# Patient Record
Sex: Male | Born: 1985 | Race: Black or African American | Hispanic: No | Marital: Single | Smoking: Never smoker
Health system: Southern US, Community
[De-identification: ages and names within clinical notes are randomized; demographics above are authoritative.]

## PROBLEM LIST (undated history)

## (undated) DIAGNOSIS — I340 Nonrheumatic mitral (valve) insufficiency: Secondary | ICD-10-CM

## (undated) DIAGNOSIS — R011 Cardiac murmur, unspecified: Secondary | ICD-10-CM

## (undated) HISTORY — DX: Cardiac murmur, unspecified: R01.1

## (undated) HISTORY — DX: Nonrheumatic mitral (valve) insufficiency: I34.0

## (undated) HISTORY — PX: SHOULDER SURGERY: SHX246

---
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
## 2005-04-26 ENCOUNTER — Emergency Department (HOSPITAL_COMMUNITY): Admission: EM | Admit: 2005-04-26 | Discharge: 2005-04-26 | Payer: Self-pay | Admitting: Emergency Medicine

## 2006-04-05 ENCOUNTER — Ambulatory Visit: Payer: Self-pay | Admitting: Family Medicine

## 2006-04-06 ENCOUNTER — Encounter: Admission: RE | Admit: 2006-04-06 | Discharge: 2006-04-06 | Payer: Self-pay | Admitting: Family Medicine

## 2007-08-18 ENCOUNTER — Emergency Department (HOSPITAL_COMMUNITY): Admission: EM | Admit: 2007-08-18 | Discharge: 2007-08-18 | Payer: Self-pay | Admitting: Emergency Medicine

## 2008-03-30 ENCOUNTER — Telehealth (INDEPENDENT_AMBULATORY_CARE_PROVIDER_SITE_OTHER): Payer: Self-pay | Admitting: *Deleted

## 2008-04-13 ENCOUNTER — Ambulatory Visit: Payer: Self-pay | Admitting: Family Medicine

## 2008-04-13 DIAGNOSIS — R079 Chest pain, unspecified: Secondary | ICD-10-CM | POA: Insufficient documentation

## 2008-04-13 DIAGNOSIS — R011 Cardiac murmur, unspecified: Secondary | ICD-10-CM | POA: Insufficient documentation

## 2008-04-16 ENCOUNTER — Ambulatory Visit: Payer: Self-pay | Admitting: Family Medicine

## 2008-04-19 ENCOUNTER — Telehealth (INDEPENDENT_AMBULATORY_CARE_PROVIDER_SITE_OTHER): Payer: Self-pay | Admitting: *Deleted

## 2008-04-19 LAB — CONVERTED CEMR LAB
Albumin: 4.2 g/dL (ref 3.5–5.2)
Basophils Absolute: 0 10*3/uL (ref 0.0–0.1)
Basophils Relative: 0.1 % (ref 0.0–3.0)
CO2: 30 meq/L (ref 19–32)
Cholesterol: 129 mg/dL (ref 0–200)
Eosinophils Absolute: 0 10*3/uL (ref 0.0–0.7)
GFR calc non Af Amer: 107.25 mL/min (ref >60)
HDL: 41 mg/dL (ref >39.00)
LDL Cholesterol: 73 mg/dL (ref 0–99)
Lymphs Abs: 1.1 10*3/uL (ref 0.7–4.0)
MCHC: 34.7 g/dL (ref 30.0–36.0)
MCV: 89.4 fL (ref 78.0–100.0)
Monocytes Relative: 11.1 % (ref 3.0–12.0)
Neutro Abs: 2 10*3/uL (ref 1.4–7.7)
Platelets: 166 10*3/uL (ref 150.0–400.0)
Potassium: 3.8 meq/L (ref 3.5–5.1)
RBC: 5.18 M/uL (ref 4.22–5.81)
RDW: 12.1 % (ref 11.5–14.6)
Sodium: 142 meq/L (ref 135–145)
Total Bilirubin: 1.1 mg/dL (ref 0.3–1.2)

## 2008-04-27 ENCOUNTER — Ambulatory Visit: Payer: Self-pay | Admitting: Cardiovascular Disease

## 2008-04-27 ENCOUNTER — Encounter: Payer: Self-pay | Admitting: Cardiovascular Disease

## 2008-05-17 ENCOUNTER — Encounter: Payer: Self-pay | Admitting: Cardiovascular Disease

## 2008-05-17 ENCOUNTER — Ambulatory Visit: Payer: Self-pay

## 2008-05-17 ENCOUNTER — Ambulatory Visit: Payer: Self-pay | Admitting: Cardiovascular Disease

## 2009-09-19 ENCOUNTER — Ambulatory Visit: Payer: Self-pay | Admitting: Family Medicine

## 2009-09-20 LAB — CONVERTED CEMR LAB
Albumin: 4.2 g/dL (ref 3.5–5.2)
Basophils Absolute: 0 10*3/uL (ref 0.0–0.1)
Basophils Relative: 0.4 % (ref 0.0–3.0)
Bilirubin, Direct: 0.2 mg/dL (ref 0.0–0.3)
Calcium: 9.3 mg/dL (ref 8.4–10.5)
Chlamydia, Swab/Urine, PCR: NEGATIVE
Cholesterol: 116 mg/dL (ref 0–200)
Creatinine, Ser: 1.1 mg/dL (ref 0.4–1.5)
GC Probe Amp, Urine: NEGATIVE
GFR calc non Af Amer: 101.64 mL/min (ref >60)
Glucose, Bld: 80 mg/dL (ref 70–99)
HCT: 42.9 % (ref 39.0–52.0)
Hemoglobin: 14.7 g/dL (ref 13.0–17.0)
Lymphs Abs: 1.1 10*3/uL (ref 0.7–4.0)
MCHC: 34.4 g/dL (ref 30.0–36.0)
MCV: 89.3 fL (ref 78.0–100.0)
Monocytes Absolute: 0.5 10*3/uL (ref 0.1–1.0)
Monocytes Relative: 11.5 % (ref 3.0–12.0)
Neutrophils Relative %: 60.4 % (ref 43.0–77.0)
Platelets: 169 10*3/uL (ref 150.0–400.0)
Sodium: 141 meq/L (ref 135–145)
Total Bilirubin: 0.9 mg/dL (ref 0.3–1.2)
Total CHOL/HDL Ratio: 3
Total Protein: 6.8 g/dL (ref 6.0–8.3)
Triglycerides: 27 mg/dL (ref 0.0–149.0)
WBC: 4.3 10*3/uL — ABNORMAL LOW (ref 4.5–10.5)

## 2010-02-19 LAB — CONVERTED CEMR LAB
Chlamydia, Swab/Urine, PCR: NEGATIVE
GC Probe Amp, Urine: NEGATIVE

## 2010-02-21 NOTE — Assessment & Plan Note (Signed)
Summary: CPX/CBS   Vital Signs:  Patient profile:   25 year old male Height:      70 inches Weight:      209 pounds BMI:     30.10 Pulse rate:   82 / minute BP sitting:   126 / 80  (left arm)  Vitals Entered By: Doristine Devoid CMA (September 19, 2009 8:29 AM) CC: CPX AND LABS   History of Present Illness: 25 yo man here today for CPE.  no concerns.  Preventive Screening-Counseling & Management  Alcohol-Tobacco     Alcohol drinks/day: 0     Smoking Status: never  Caffeine-Diet-Exercise     Does Patient Exercise: yes     Type of exercise: weight lifting, running     Times/week: 6      Drug Use:  never.    Current Medications (verified): 1)  No Meds At This Time  Allergies (verified): No Known Drug Allergies  Past History:  Past Medical History: + heart murmur- cleared by cards as child Normal ECHO and cards w/u- 2010  Past Surgical History: Reviewed history from 04/13/2008 and no changes required. heart cath in 7th grade, no intervention performed.  pt not sure what prompted procedure R- shoulder Rotator cuff repair  Family History: Reviewed history from 04/13/2008 and no changes required. 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    Physical Exam  General:  Well-developed,well-nourished,in no acute distress; alert,appropriate and cooperative throughout  examination Head:  Normocephalic and atraumatic without obvious abnormalities. Eyes:  No corneal or conjunctival inflammation noted. EOMI. Perrla. Funduscopic exam benign, without hemorrhages, exudates or papilledema. Vision grossly normal. Ears:  External ear exam shows no significant lesions or deformities.  Otoscopic examination reveals clear canals, tympanic membranes are intact bilaterally without bulging, retraction, inflammation or discharge. Hearing is grossly normal bilaterally. Nose:  External nasal examination shows no deformity or inflammation. Nasal mucosa are pink and moist without lesions or exudates. Mouth:  Oral mucosa and oropharynx without lesions or exudates.  Teeth in good repair.  braces in place Neck:  No deformities, masses, or tenderness noted. Lungs:  Normal respiratory effort, chest expands symmetrically. Lungs are clear to auscultation, no crackles or wheezes. Heart:  III/VI SEM heard best at upper sternal border Abdomen:  Bowel sounds positive,abdomen soft and non-tender without masses, organomegaly or hernias noted. Genitalia:  Testes bilaterally descended without nodularity, tenderness or masses. No scrotal masses or lesions. No penis lesions or urethral discharge. Msk:  No deformity or scoliosis noted of thoracic or lumbar spine.   Pulses:  +2 carotid, radial, DP Extremities:  No clubbing, cyanosis, edema, or deformity noted with normal full range of motion of all joints.   Neurologic:  No cranial nerve deficits noted. Station and gait are normal. Plantar reflexes are down-going bilaterally. DTRs are symmetrical throughout. Sensory, motor and coordinative functions appear intact. Skin:  Intact without suspicious lesions or rashes, tattoos Cervical Nodes:  No lymphadenopathy noted Inguinal Nodes:  No significant adenopathy Psych:  Cognition and judgment appear intact. Alert and cooperative with  normal attention span and concentration. No apparent delusions, illusions,  hallucinations   Impression & Recommendations:  Problem # 1:  HEALTHY ADULT MALE (ICD-V70.0) Assessment Unchanged pt's PE WNL, unchanged from previous.  check labs.  anticipatory guidance provided. Orders: Venipuncture (16109) TLB-Lipid Panel (80061-LIPID) TLB-BMP (Basic Metabolic Panel-BMET) (80048-METABOL) TLB-CBC Platelet - w/Differential (85025-CBCD) TLB-Hepatic/Liver Function Pnl (80076-HEPATIC) TLB-TSH (Thyroid Stimulating Hormone) (84443-TSH)  Problem # 2:  CONTACT OR EXPOSURE TO OTHER VIRAL DISEASES (ICD-V01.79) Assessment: Unchanged check labs at pt's request Orders: T-GC Probe, urine [phone]) T-Chlamydia  Probe, urine [id number]) T-RPR (Syphilis) ([id number]) T-HIV Antibody  (Reflex) ([id number])  Complete Medication List: 1)  No Meds At This Time   Patient Instructions: 1)  Follow up in 1 year or as needed 2)  Keep up the good work on diet and exercise 3)  We'll notify you of your lab results 4)  Call with any questions or concerns 5)  Have a great labor day!

## 2010-06-09 NOTE — Op Note (Signed)
NAMEJAREE, [name]                            ACCOUNT NO.:  [id number]   MEDICAL RECORD NO.:  [id number]                   PATIENT TYPE:  AMB   LOCATION:  [facility]SC                                  FACILITY:  [facility]   PHYSICIAN:  Robert A. Thurston Hole, M.D.              DATE OF BIRTH:  1985/09/17   DATE OF PROCEDURE:  12/16/2001  DATE OF DISCHARGE:                                 OPERATIVE REPORT   PREOPERATIVE DIAGNOSIS:  Right shoulder instability.   POSTOPERATIVE DIAGNOSIS:  1. 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  2. Right shoulder glenoid labrum tear with loose body.   PROCEDURE:  1. Right shoulder EOA followed by arthroscopic debridement and removal of     loose body.  2. Right shoulder open Bankart repair using Arthrex suture anchors x3.   SURGEON:  Elana Alm. Thurston Hole, M.D.   ASSISTANT:  Candace Cruise. Minor, P.A.   ANESTHESIA:  General.   OPERATIVE TIME:  One hour and forty-five minutes.   COMPLICATIONS:  None.   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  Persistent pain and instability that has failed  conservative care with exam and MRI documenting Bankart lesion and  instability and is now to undergo arthroscopy and Bankart repair.   DESCRIPTION:  The patient was brought to the operating room on December 16, 2001 placed on the operative table in supine position.  After an adequate  level of general anesthesia was obtained his right shoulder was examined  under anesthesia.  He had full range of motion and his shoulder showed  anterior-inferior subluxation on the right which he did not have on the  left.  Both shoulders have mild posterior laxity equal in nature.  He was  then placed in a beach-chair position and his shoulder and arm was prepped  using sterile Betadine and draped using sterile technique.  He received  Ancef 1 g IV preoperatively for prophylaxis.  Initially the arthroscopy was  performed.  Through a posterior arthroscopic portal the arthroscope with a  pump attached was placed and through an anterior portal an arthroscopic  probe was placed.  On initial inspection the articular cartilage in the  glenohumeral joint was intact.  The anterior labrum showed tearing with a  loose piece of labral tissue in the anterior labrum area and this was  debrided and removed.  The anterior-inferior labrum was detached from the  glenoid consistent with a Bankart lesion along with the anterior-inferior  glenohumeral ligament complex from the 6 o'clock position up to the 3  o'clock position.  The superior labrum was intact, the biceps tendon anchor  was intact and the biceps tendon was intact.  The posterior labrum was also  intact.  The rotator cuff was thoroughly inspected and there was found  to be  no evidence of a tear.  At this point then it was felt that the Bankart  reconstruction would be indicated.  The arthroscopic instruments were  removed.  A 5 cm anterior axillary incision was made.  The underlying  subcutaneous tissues were incised along with the skin incision.  The  deltopectoral groove was entered and the cephalic vein carefully protected  while the underlying scapularis tendon was exposed and the conjoint tendon  carefully retracted protecting the musculocutaneous nerve.  The  subscapularis tendon was then detached from its lesser tuberosity attachment  making superior and inferior limbs protecting the axillary nerve.  The  anterior aspect of the shoulder joint was then entered.  The anterior-  inferior glenoid rim was exposed.  An Arthrex suture anchor was first placed  in the 5:30 position and then this suture was passed through the anterior-  inferior glenohumeral ligament complex which had been torn and detached from  the glenoid rim.  Two other Arthrex suture anchors were placed - one  in the  4 o'clock position and one in the 2 o'clock position.  Each one of these had  the sutures passed through the torn labral anterior-inferior glenohumeral  ligament complex as well.  After the sutures were passed and each of these  were tied down tightly thus reapproximating and reefing and reconstructing  the anterior-inferior glenohumeral ligament complex back to its normal  anatomic position.  After this was done there was found to be no further  instability.  At this point then the wound was irrigated.  The subscapularis  tendon along with the anterior joint capsule was reattached to its lesser  tuberosity attachment using 2-Ethibond suture.  At this point the shoulder  could be brought up to the 90 degrees of abduction and 60 degrees of  external rotation before there was any excessive tension on the repair.  The  wound was further irrigated then closed.  0-Vicryl was used to close the  deltopectoral fascia and subcutaneous tissues and 2-0 Vicryl for the  subcuticular layer.  Steri-Strips were applied.  The arthroscopic holes were  closed with 3-0 nylon suture.  Sterile dressings and a shoulder immobilizer  was applied after the wound was injected with 0.25% Marcaine.  The patient  then awakened, extubated, taken to recovery room in stable condition.  Needle and sponge counts correct x2 at the end of the case.   FOLLOWUP CARE:  The patient will be followed as an overnight recovery care  patient for IV pain control and neurovascular monitoring.  Discharge  tomorrow on Percocet and Naprosyn.  See him back in the office in a week for  sutures out and followup.                                               Robert A. Thurston Hole, M.D.    RAW/MEDQ  D:  12/16/2001  T:  12/16/2001  Job:  [id number]

## 2010-06-09 NOTE — Assessment & Plan Note (Signed)
[facility]                        [facility] [facility] OFFICE NOTE   NAME:[name], [name]                         MRN:          [id number]  DATE:04/05/2006                            DOB:          September 14, 1985    REASON FOR VISIT:  Low back pain.   [name] is a 25 year old male who presents here to establish care.  Patient reports that about 3 weeks ago he started having right lower  back pain.  The pain was very severe that he went to the urgent care.  He had been seen a couple of times, and had been diagnosed with muscle  spasms and put on muscle relaxant and ibuprofen.  Patient reports that  he had an x-ray which was unremarkable.  He continues to have  significant discomfort, and reports that Tuesday the pain radiated down  his right leg.  Patient is a Pharmacist, hospital at [employer].  [name] reports that any type of bending precipitates the discomfort,  even coughing causes some discomfort in the lower back.  He denies any  bladder or bowel dysfunction.   PAST MEDICAL HISTORY:  None.   PHYSICAL HISTORY:  Right shoulder surgery for rotator cuff repair.   FAMILY HISTORY:  Mother and father alive and well.  He has 4 siblings  who are alive and well.   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  He is single and denies any  alcohol or tobacco use.   HEALTH MAINTENANCE:  Patient states he is up to date on his  immunizations.   REVIEW OF SYSTEMS:  As per HPI.  Additionally, he denies any  paresthesias or weakness of his lower extremity.  He also denies any  urinary symptoms.   OBJECTIVE:  Blood pressure 112/76, weight 193, pulse 78.  GENERAL:  A pleasant, muscularly-developed male in no acute distress.  Examination of the lower back significant for no midline tenderness.  There is mild-to-moderate tenderness in the right lower spinal lumbar  area.  Straight leg raise was  positive, greater left than right.  Deep  tendon reflexes were 2+ and equal bilaterally.  Patient was able to walk  on his heels and toes without significant difficulty.   IMPRESSION:  A 25 year old male with 3-week history of right lower back  pain, now radiating into his right leg.  Nerve impingement secondary to  a bulging disk is high on the differential.   PLAN:  1. Advise patient that he can continue with the muscle relaxants      given.  Additionally, we will add a prednisone pack 5/21, side      effects and instructions provided.  2. Will obtain an MRI for further assessment of his L-spine.  3. Will have patient sign the release for records, to obtain records      from [facility]      on [address].  4. Patient advised if his symptoms worsen in any way, he needs to seek  medical attention.     Leanne Chang, M.D.  Electronically Signed    LA/MedQ  DD: 04/05/2006  DT: 04/06/2006  Job #: [id number]

## 2010-08-10 ENCOUNTER — Encounter: Payer: Self-pay | Admitting: Family Medicine

## 2010-08-23 ENCOUNTER — Ambulatory Visit (INDEPENDENT_AMBULATORY_CARE_PROVIDER_SITE_OTHER): Payer: BC Managed Care – PPO | Admitting: Family Medicine

## 2010-08-23 ENCOUNTER — Encounter: Payer: Self-pay | Admitting: Family Medicine

## 2010-08-23 DIAGNOSIS — Z20828 Contact with and (suspected) exposure to other viral communicable diseases: Secondary | ICD-10-CM

## 2010-08-23 DIAGNOSIS — Z0000 Encounter for general adult medical examination without abnormal findings: Secondary | ICD-10-CM

## 2010-08-23 DIAGNOSIS — M549 Dorsalgia, unspecified: Secondary | ICD-10-CM

## 2010-08-23 LAB — LIPID PANEL
HDL: 47.8 mg/dL (ref >39.00)
Triglycerides: 57 mg/dL (ref 0.0–149.0)

## 2010-08-23 LAB — RPR

## 2010-08-23 LAB — BASIC METABOLIC PANEL
CO2: 26 mEq/L (ref 19–32)
Chloride: 103 mEq/L (ref 96–112)
Creatinine, Ser: 1.1 mg/dL (ref 0.4–1.5)
GFR: 104.01 mL/min (ref >60.00)
Glucose, Bld: 89 mg/dL (ref 70–99)

## 2010-08-23 LAB — CBC WITH DIFFERENTIAL/PLATELET
Basophils Absolute: 0 10*3/uL (ref 0.0–0.1)
Basophils Relative: 0.3 % (ref 0.0–3.0)
Eosinophils Absolute: 0.1 10*3/uL (ref 0.0–0.7)
Eosinophils Relative: 1.2 % (ref 0.0–5.0)
HCT: 46.7 % (ref 39.0–52.0)
MCHC: 33.4 g/dL (ref 30.0–36.0)
Monocytes Relative: 10.2 % (ref 3.0–12.0)
Neutro Abs: 2.5 10*3/uL (ref 1.4–7.7)

## 2010-08-23 MED ORDER — NAPROXEN 500 MG PO TABS: 500.0000 mg | ORAL_TABLET | Freq: Two times a day (BID) | ORAL | Status: DC

## 2010-08-23 NOTE — Progress Notes (Signed)
  Subjective:    Patient ID: [name], male    DOB: 03/25/1985, 25 y.o.   MRN: [id number]  HPI CPE- no concerns today.   Review of Systems Patient reports no vision/hearing changes, anorexia, fever ,adenopathy, persistant/recurrent hoarseness, swallowing issues, chest pain, palpitations, edema, persistant/recurrent cough, hemoptysis, dyspnea (rest,exertional, paroxysmal nocturnal), gastrointestinal  bleeding (melena, rectal bleeding), abdominal pain, excessive heart burn, GU symptoms (dysuria, hematuria, voiding/incontinence issues) syncope, focal weakness, memory loss, numbness & tingling, skin/hair/nail changes, depression, anxiety, abnormal bruising/bleeding.  + back pain after lifting at gym on Monday    Objective:   Physical Exam BP 118/80  Pulse 59  Temp(Src) 98.6 F (37 C) (Oral)  Ht 5' 9.5" (1.765 m)  Wt 209 lb 12.8 oz (95.165 kg)  BMI 30.54 kg/m2  SpO2 98%  General Appearance:    Alert, cooperative, no distress, appears stated age  Head:    Normocephalic, without obvious abnormality, atraumatic  Eyes:    PERRL, conjunctiva/corneas clear, EOM's intact, fundi    benign, both eyes       Ears:    Normal TM's and external ear canals, both ears  Nose:   Nares normal, septum midline, mucosa normal, no drainage   or sinus tenderness  Throat:   Lips, mucosa, and tongue normal; teeth and gums normal.  Very large tonsils w/out erythema or exudate  Neck:   Supple, symmetrical, trachea midline, no adenopathy;       thyroid:  No enlargement/tenderness/nodules  Back:     Symmetric, no curvature, + R lumbar paraspinal tenderness and spasm.  Limited flexion and extension due to pain.  + SLR on R  Lungs:     Clear to auscultation bilaterally, respirations unlabored  Chest wall:    No tenderness or deformity  Heart:    Regular rate and rhythm, S1 and S2 normal, I/VI SEM at LUSB, no rub   or gallop  Abdomen:     Soft, non-tender, bowel sounds active all four quadrants,    no masses,  no organomegaly  Genitalia:    Normal male without lesion, masses, discharge or tenderness  Rectal:    Deferred due to age  Extremities:   Extremities normal, atraumatic, no cyanosis or edema  Pulses:   2+ and symmetric all extremities  Skin:   Skin color, texture, turgor normal, no rashes or lesions  Lymph nodes:   Cervical, supraclavicular, and axillary nodes normal  Neurologic:   CNII-XII intact. Normal strength, sensation and reflexes      throughout          Assessment & Plan:

## 2010-08-23 NOTE — Patient Instructions (Signed)
Your exam looks great!  Keep up the good work! Take the Naproxen- w/ food to avoid upset stomach- twice daily for the next 7-10 days for your back pain Use a heating pad for pain relief We'll notify you of your lab results If your pain is no better in 2 weeks- call me! Hang in there!

## 2010-08-23 NOTE — Assessment & Plan Note (Signed)
Check labs.  Discussed importance of safe sex.

## 2010-08-23 NOTE — Assessment & Plan Note (Signed)
Pt w/ lumbar strain after lifting.  Start scheduled NSAIDs, heating pad prn for pain relief.  If no improvement or worsening, pt to call and will refer to ortho.  Pt expressed understanding and is in agreement w/ plan.

## 2010-08-23 NOTE — Assessment & Plan Note (Signed)
Pt's PE WNL w/ exception of low back pain.  Check labs.  Anticipatory guidance provided.

## 2010-08-24 LAB — HEPATIC FUNCTION PANEL
ALT: 19 U/L (ref 0–53)
AST: 20 U/L (ref 0–37)
Albumin: 4.5 g/dL (ref 3.5–5.2)
Alkaline Phosphatase: 51 U/L (ref 39–117)
Bilirubin, Direct: 0 mg/dL (ref 0.0–0.3)
Total Bilirubin: 0.7 mg/dL (ref 0.3–1.2)
Total Protein: 7.2 g/dL (ref 6.0–8.3)

## 2010-08-24 LAB — GC/CHLAMYDIA PROBE AMP, URINE
Chlamydia, Swab/Urine, PCR: NEGATIVE
GC Probe Amp, Urine: NEGATIVE

## 2010-08-25 ENCOUNTER — Encounter: Payer: Self-pay | Admitting: *Deleted

## 2010-10-12 ENCOUNTER — Ambulatory Visit (INDEPENDENT_AMBULATORY_CARE_PROVIDER_SITE_OTHER): Payer: BC Managed Care – PPO | Admitting: Family Medicine

## 2010-10-12 ENCOUNTER — Encounter: Payer: Self-pay | Admitting: Family Medicine

## 2010-10-12 VITALS — BP 126/82 | Temp 99.0°F | Wt 205.0 lb

## 2010-10-12 DIAGNOSIS — R1013 Epigastric pain: Secondary | ICD-10-CM

## 2010-10-12 MED ORDER — ESOMEPRAZOLE MAGNESIUM 40 MG PO CPDR: 40.0000 mg | DELAYED_RELEASE_CAPSULE | Freq: Every day | ORAL | Status: DC

## 2010-10-12 NOTE — Patient Instructions (Signed)
We'll notify you of your lab results Start the Nexium daily If no better in 1 week- call me and we'll send you to GI Call with any questions or concerns Hang in there!

## 2010-10-12 NOTE — Assessment & Plan Note (Signed)
sxs consistent w/ gastritis vs PUD vs GERD.  Must r/o pancreatitis.  Start Nexium.  Check labs.  Reviewed supportive care and red flags that should prompt return. Pt expressed understanding and is in agreement w/ plan.

## 2010-10-12 NOTE — Progress Notes (Signed)
  Subjective:    Patient ID: [name], male    DOB: 09-Oct-1985, 25 y.o.   MRN: [id number]  HPI abd pain- sxs started 2 weeks ago after eating spinach salad, chicken, apple.  Drank a ginger-ale w/ some improvement.  sxs returned as 'sharp pain at top of my stomach'.  Also had sensation of food in chest/throat.  Took tums w/ some relief.  Then started Omeprazole w/ some relief.  Yesterday had severe 'stabbing pain'.  + nausea.  Worse w/ eating.   Not taking ibuprofen or naproxen.   Review of Systems For ROS see HPI     Objective:   Physical Exam  Vitals reviewed. Constitutional: He appears well-developed and well-nourished. No distress.  Abdominal: Soft. Bowel sounds are normal. He exhibits no distension and no mass. There is tenderness (epigastric tenderness w/ palpation). There is no rebound and no guarding.          Assessment & Plan:

## 2010-10-13 ENCOUNTER — Telehealth: Payer: Self-pay

## 2010-10-13 ENCOUNTER — Other Ambulatory Visit: Payer: Self-pay | Admitting: Family Medicine

## 2010-10-13 DIAGNOSIS — R1013 Epigastric pain: Secondary | ICD-10-CM

## 2010-10-13 LAB — CBC WITH DIFFERENTIAL/PLATELET
Eosinophils Absolute: 0.1 10*3/uL (ref 0.0–0.7)
Eosinophils Relative: 1.5 % (ref 0.0–5.0)
Hemoglobin: 15.5 g/dL (ref 13.0–17.0)
Lymphs Abs: 1.2 10*3/uL (ref 0.7–4.0)
Monocytes Absolute: 0.4 10*3/uL (ref 0.1–1.0)
Monocytes Relative: 11.5 % (ref 3.0–12.0)
RBC: 5.17 Mil/uL (ref 4.22–5.81)
RDW: 12.9 % (ref 11.5–14.6)
WBC: 3.4 10*3/uL — ABNORMAL LOW (ref 4.5–10.5)

## 2010-10-13 LAB — LIPASE: Lipase: 30 U/L (ref 11.0–59.0)

## 2010-10-13 LAB — HEPATIC FUNCTION PANEL
Albumin: 4.4 g/dL (ref 3.5–5.2)
Alkaline Phosphatase: 62 U/L (ref 39–117)

## 2010-10-13 LAB — BASIC METABOLIC PANEL
Glucose, Bld: 89 mg/dL (ref 70–99)
Potassium: 4 mEq/L (ref 3.5–5.1)
Sodium: 141 mEq/L (ref 135–145)

## 2010-10-13 LAB — H. PYLORI ANTIBODY, IGG: H Pylori IgG: NEGATIVE

## 2010-10-13 LAB — AMYLASE: Amylase: 69 U/L (ref 27–131)

## 2010-10-13 NOTE — Telephone Encounter (Signed)
Pt aware.

## 2010-10-13 NOTE — Telephone Encounter (Signed)
Message copied by Beverely Low on Fri Oct 13, 2010  3:19 PM ------      Message from: Sheliah Hatch      Created: Fri Oct 13, 2010  2:51 PM       Pancreatic enzymes look good.  No H pylori.  If symptoms don't improve in next week, needs to let us know so we can send him to GI

## 2010-10-13 NOTE — Progress Notes (Signed)
Quick Note:  Pt aware and notes that he had the same sharp abdominal pain last night before eating dinner. ______

## 2010-10-16 ENCOUNTER — Encounter: Payer: Self-pay | Admitting: Gastroenterology

## 2010-10-20 ENCOUNTER — Telehealth: Payer: Self-pay | Admitting: Family Medicine

## 2010-10-20 NOTE — Telephone Encounter (Signed)
I agree w/ Dr Bosie Clos- he's the expert in these matters.

## 2010-10-20 NOTE — Telephone Encounter (Signed)
Discuss with patient  

## 2010-10-20 NOTE — Telephone Encounter (Signed)
Patient states that  [name] at [facility] told him he should have esophageal GI visit which he has scheduled 10/24/10, patient wants to know your opinion

## 2010-10-26 ENCOUNTER — Other Ambulatory Visit (HOSPITAL_COMMUNITY)
Admission: RE | Admit: 2010-10-26 | Discharge: 2010-10-26 | Disposition: A | Discharge status: Home / Self Care | Payer: BC Managed Care – PPO | Source: Ambulatory Visit | Attending: Gastroenterology | Admitting: Gastroenterology

## 2010-10-26 ENCOUNTER — Other Ambulatory Visit: Payer: Self-pay | Admitting: Gastroenterology

## 2010-10-26 DIAGNOSIS — B379 Candidiasis, unspecified: Secondary | ICD-10-CM | POA: Insufficient documentation

## 2010-11-09 ENCOUNTER — Ambulatory Visit: Payer: BC Managed Care – PPO | Admitting: Gastroenterology

## 2011-03-01 ENCOUNTER — Other Ambulatory Visit: Payer: Self-pay

## 2011-03-01 ENCOUNTER — Emergency Department (HOSPITAL_COMMUNITY)
Admission: EM | Admit: 2011-03-01 | Discharge: 2011-03-01 | Disposition: A | Discharge status: Home / Self Care | Payer: BC Managed Care – PPO | Attending: Emergency Medicine | Admitting: Emergency Medicine

## 2011-03-01 ENCOUNTER — Encounter (HOSPITAL_COMMUNITY): Payer: Self-pay | Admitting: *Deleted

## 2011-03-01 ENCOUNTER — Emergency Department (HOSPITAL_COMMUNITY): Payer: BC Managed Care – PPO

## 2011-03-01 DIAGNOSIS — R011 Cardiac murmur, unspecified: Secondary | ICD-10-CM | POA: Insufficient documentation

## 2011-03-01 DIAGNOSIS — R079 Chest pain, unspecified: Secondary | ICD-10-CM | POA: Insufficient documentation

## 2011-03-01 DIAGNOSIS — Z79899 Other long term (current) drug therapy: Secondary | ICD-10-CM | POA: Insufficient documentation

## 2011-03-01 LAB — CBC
MCH: 30 pg (ref 26.0–34.0)
RBC: 5.07 MIL/uL (ref 4.22–5.81)
RDW: 12.4 % (ref 11.5–15.5)
WBC: 8.3 10*3/uL (ref 4.0–10.5)

## 2011-03-01 LAB — D-DIMER, QUANTITATIVE: D-Dimer, Quant: 0.31 ug/mL-FEU (ref 0.00–0.48)

## 2011-03-01 LAB — BASIC METABOLIC PANEL
Chloride: 103 mEq/L (ref 96–112)
Creatinine, Ser: 1.12 mg/dL (ref 0.50–1.35)
GFR calc non Af Amer: >90 mL/min (ref >90)
Glucose, Bld: 94 mg/dL (ref 70–99)
Potassium: 4.1 mEq/L (ref 3.5–5.1)
Sodium: 137 mEq/L (ref 135–145)

## 2011-03-01 LAB — TROPONIN I: Troponin I: <0.3 ng/mL (ref <0.30)

## 2011-03-01 MED ORDER — OXYCODONE-ACETAMINOPHEN 5-325 MG PO TABS
2.0000 | ORAL_TABLET | ORAL | Status: AC | PRN
Start: 2011-03-01 — End: 2011-03-11

## 2011-03-01 MED ORDER — SODIUM CHLORIDE 0.9 % IV SOLN
20.0000 mL | INTRAVENOUS | Status: DC
  Administered 2011-03-01: 20 mL via INTRAVENOUS

## 2011-03-01 MED ORDER — OXYCODONE-ACETAMINOPHEN 5-325 MG PO TABS
1.0000 | ORAL_TABLET | Freq: Once | ORAL | Status: AC
  Administered 2011-03-01: 1 via ORAL
  Filled 2011-03-01: qty 1

## 2011-03-01 NOTE — Discharge Instructions (Signed)
Get plenty of rest, avoid physical activity for several days. See her primary care doctor if not better in one week.  Chest Pain (Nonspecific) It is often hard to give a specific diagnosis for the cause of chest pain. There is always a chance that your pain could be related to something serious, such as a heart attack or a blood clot in the lungs. You need to follow up with your caregiver for further evaluation. CAUSES   Heartburn.   Pneumonia or bronchitis.   Anxiety and stress.   Inflammation around your heart (pericarditis) or lung (pleuritis or pleurisy).   A blood clot in the lung.   A collapsed lung (pneumothorax). It can develop suddenly on its own (spontaneous pneumothorax) or from injury (trauma) to the chest.  The chest wall is composed of bones, muscles, and cartilage. Any of these can be the source of the pain.  The bones can be bruised by injury.   The muscles or cartilage can be strained by coughing or overwork.   The cartilage can be affected by inflammation and become sore (costochondritis).  DIAGNOSIS  Lab tests or other studies, such as X-rays, an EKG, stress testing, or cardiac imaging, may be needed to find the cause of your pain.  TREATMENT   Treatment depends on what may be causing your chest pain. Treatment may include:   Acid blockers for heartburn.   Anti-inflammatory medicine.   Pain medicine for inflammatory conditions.   Antibiotics if an infection is present.   You may be advised to change lifestyle habits. This includes stopping smoking and avoiding caffeine and chocolate.   You may be advised to keep your head raised (elevated) when sleeping. This reduces the chance of acid going backward from your stomach into your esophagus.   Most of the time, nonspecific chest pain will improve within 2 to 3 days with rest and mild pain medicine.  HOME CARE INSTRUCTIONS   If antibiotics were prescribed, take the full amount even if you start to feel  better.   For the next few days, avoid physical activities that bring on chest pain. Continue physical activities as directed.   Do not smoke cigarettes or drink alcohol until your symptoms are gone.   Only take over-the-counter or prescription medicine for pain, discomfort, or fever as directed by your caregiver.   Follow your caregiver's suggestions for further testing if your chest pain does not go away.   Keep any follow-up appointments you made. If you do not go to an appointment, you could develop lasting (chronic) problems with pain. If there is any problem keeping an appointment, you must call to reschedule.  SEEK MEDICAL CARE IF:   You think you are having problems from the medicine you are taking. Read your medicine instructions carefully.   Your chest pain does not go away, even after treatment.   You develop a rash with blisters on your chest.  SEEK IMMEDIATE MEDICAL CARE IF:   You have increased chest pain or pain that spreads to your arm, neck, jaw, back, or belly (abdomen).   You develop shortness of breath, an increasing cough, or you are coughing up blood.   You have severe back or abdominal pain, feel sick to your stomach (nauseous) or throw up (vomit).   You develop severe weakness, fainting, or chills.   You have an oral temperature above 102 F (38.9 C), not controlled by medicine.  THIS IS AN EMERGENCY. Do not wait to see if the  pain will go away. Get medical help at once. Call your local emergency services (911 in [place] Do not drive yourself to the hospital. MAKE SURE YOU:   Understand these instructions.   Will watch your condition.   Will get help right away if you are not doing well or get worse.  Document Released: 10/18/2004 Document Revised: 09/20/2010 Document Reviewed: 08/14/2007 [facility] Patient Information 2012 [place], [place].

## 2011-03-01 NOTE — ED Notes (Signed)
Dr Wentz in to see patient. 

## 2011-03-01 NOTE — ED Provider Notes (Addendum)
History     CSN: [id number]  Arrival date & time 03/01/11  7829   First MD Initiated Contact with Patient 03/01/11 0701      Chief Complaint  Patient presents with  . Chest Pain    pt states chest pain yesterday am. pt states "it feels like someone  is stabbing me in the chest."    (Consider location/radiation/quality/duration/timing/severity/associated sxs/prior treatment) HPI [name] is a 26 y.o. male who had onset persistent chest pain yesterday afternoon. There was no known cause. He took Zantac with mild relief but the pain persisted. He was able to sleep without problems. He did not eat today, but is not nauseated and has no vomiting. He states he is hungry. The pain is worse with movement and deep breathing. It is felt in the right lower anterior chest. He has had similar chest pain in the past. He was evaluated with a cardiac catheter at age 26 apparently for a murmur. He was told everything was okay. Within the last 3 years. He has been evaluated at a local cardiologist and pronounced OK to do any activities. He exercises regularly both boxing and weight lifting. He has no ongoing peptic ulcer disease or GERD.       Past Medical History  Diagnosis Date  . Heart murmur     Past Surgical History  Procedure Date  . Cardiac catheterization     7th grade, pt not sure what prompted procedure  . Doppler echocardiography 2010    normal    Family History  Problem Relation Age of Onset  . Hypertension Mother   . Hypertension Father   . Diabetes      grandmother    History  Substance Use Topics  . Smoking status: Never Smoker   . Smokeless tobacco: Never Used  . Alcohol Use: No      Review of Systems  All other systems reviewed and are negative.    Allergies  Review of patient's allergies indicates no known allergies.  Home Medications   Current Outpatient Rx  Name Route Sig Dispense Refill  . ACETAMINOPHEN 500 MG PO TABS Oral Take 1,000 mg by mouth  every 6 (six) hours as needed. For pain    . RANITIDINE HCL 75 MG PO TABS Oral Take 75 mg by mouth 2 (two) times daily.    . OXYCODONE-ACETAMINOPHEN 5-325 MG PO TABS Oral Take 2 tablets by mouth every 4 (four) hours as needed for pain. 15 tablet 0    BP 138/69  Pulse 84  Temp(Src) 98.8 F (37.1 C) (Oral)  Resp 18  Ht 5\' 8"  (1.727 m)  Wt 207 lb (93.895 kg)  BMI 31.47 kg/m2  SpO2 100%  Physical Exam  Nursing note and vitals reviewed. Constitutional: He is oriented to person, place, and time. He appears well-developed and well-nourished. No distress.  HENT:  Head: Normocephalic and atraumatic.  Right Ear: External ear normal.  Left Ear: External ear normal.  Eyes: Conjunctivae and EOM are normal. Pupils are equal, round, and reactive to light.  Neck: Normal range of motion and phonation normal. Neck supple.  Cardiovascular: Normal rate, regular rhythm, normal heart sounds and intact distal pulses.        Is a grade 2/6 systolic murmur left upper sternal border. No associated heave or thrill  Pulmonary/Chest: Effort normal and breath sounds normal. He exhibits no bony tenderness.       The chest wall is nontender to palpation  Abdominal: Soft. Normal appearance.  There is no tenderness.       There is no right upper quadrant tenderness, or mass  Musculoskeletal: Normal range of motion.  Neurological: He is alert and oriented to person, place, and time. He has normal strength. No cranial nerve deficit or sensory deficit. He exhibits normal muscle tone. Coordination normal.  Skin: Skin is warm, dry and intact.  Psychiatric: He has a normal mood and affect. His behavior is normal. Judgment and thought content normal.    ED Course  Procedures (including critical care time)   Labs Reviewed  CBC  BASIC METABOLIC PANEL  D-DIMER, QUANTITATIVE  TROPONIN I   Dg Chest Portable 1 View  03/01/2011  *RADIOLOGY REPORT*  Clinical Data: Nonsmoker with chest pain.  PORTABLE CHEST - 1 VIEW   Comparison: 04/26/2005 radiographs and CT.  Findings: 0735 hours.  The heart size and mediastinal contours appear stable allowing for AP technique and mild patient rotation to the left.  The lungs are clear.  There is no pleural effusion or pneumothorax.  The osseous structures appear unremarkable. Telemetry leads overlie the chest.  IMPRESSION: Stable examination allowing for technical differences.  No acute cardiopulmonary process.  Original Report Authenticated By: Gerrianne Scale, M.D.   10:21 AM Reevaluation with update and discussion. After initial assessment and treatment, an updated evaluation reveals he is more comfortable after Percocet. [name]   1. Chest pain     Date: 03/01/2011  Rate: 77  Rhythm: normal sinus rhythm  QRS Axis: normal  Intervals: normal  ST/T Wave abnormalities: normal  Conduction Disutrbances:none  Narrative Interpretation:   Old EKG Reviewed: unchanged    MDM  Nonspecific chest pain, with reassuring. Evaluation in the ED. Doubt PE, ACS, pneumonia, occult infection, metabolic instability and GERD. No complicating features associated with his known cardiac murmur. An echocardiogram done in 2010  revealed trivial mitral regurgitation and mild tricuspid regurg. The patient additionally notes that he has seen GI and had endoscopy, upper, in the last year and was told that it was normal.          Flint Melter, MD 03/01/11 1022  Flint Melter, MD 03/01/11 1258

## 2011-03-01 NOTE — ED Notes (Signed)
Pt taken to triage 1 and made comfortable with family at bedside.

## 2011-03-02 ENCOUNTER — Ambulatory Visit (INDEPENDENT_AMBULATORY_CARE_PROVIDER_SITE_OTHER): Payer: BC Managed Care – PPO | Admitting: Family Medicine

## 2011-03-02 ENCOUNTER — Encounter: Payer: Self-pay | Admitting: Family Medicine

## 2011-03-02 DIAGNOSIS — K219 Gastro-esophageal reflux disease without esophagitis: Secondary | ICD-10-CM

## 2011-03-02 DIAGNOSIS — J069 Acute upper respiratory infection, unspecified: Secondary | ICD-10-CM

## 2011-03-02 DIAGNOSIS — R079 Chest pain, unspecified: Secondary | ICD-10-CM

## 2011-03-02 MED ORDER — ESOMEPRAZOLE MAGNESIUM 40 MG PO CPDR: 40.0000 mg | DELAYED_RELEASE_CAPSULE | Freq: Every day | ORAL | Status: DC

## 2011-03-02 MED ORDER — AZITHROMYCIN 250 MG PO TABS: 250.0000 mg | ORAL_TABLET | Freq: Every day | ORAL | Status: AC

## 2011-03-02 NOTE — Patient Instructions (Signed)
This sounds like a flare of your reflux Restart the Nexium daily- stop this on March 1 Try and eat regularly to avoid your stomach being empty Take the Azithromycin as directed for your upper respiratory illness Add Mucinex to thin your congestion Drink plenty of fluids REST! Hang in there!!!

## 2011-03-02 NOTE — Assessment & Plan Note (Signed)
Acute.  Reviewed pt's ER records.  Does not appear to be cardiac in nature.  More consistent w/ silent GERD or esophageal spasm.  Restart Nexium.  Percocet prn (given in ER).  Pt asymptomatic today.  Reviewed supportive care and red flags that should prompt return.  Pt expressed understanding and is in agreement w/ plan.

## 2011-03-02 NOTE — Progress Notes (Signed)
  Subjective:    Patient ID: [name], male    DOB: 1985-07-03, 26 y.o.   MRN: [id number]  HPI Chest pain- went to ER yesterday, had previous GI workup w/ normal EGD.  Stopped Nexium in November.  2 days ago had central CP.  'stabbing' pain.  This is 2nd occurrence.  Took Zantac that night w/ some improvement.  When he woke up in the AM pain was 8-9/10.  Went to hospital, normal EKG, CXR- took percocet w/ some improvement.  Yesterday developed cough and fever, Tm 100.5.  Now w/ some wheezing.  This morning had sweats.  Denies body aches.  Cough is nonproductive.  + sick contacts.  No TTP on chest wall.   Review of Systems For ROS see HPI     Objective:   Physical Exam  Vitals reviewed. Constitutional: He appears well-developed and well-nourished. No distress.  HENT:  Head: Normocephalic and atraumatic.  Right Ear: Tympanic membrane normal.  Left Ear: Tympanic membrane normal.  Nose: No mucosal edema or rhinorrhea. Right sinus exhibits no maxillary sinus tenderness and no frontal sinus tenderness. Left sinus exhibits no maxillary sinus tenderness and no frontal sinus tenderness.  Mouth/Throat: Mucous membranes are normal. No oropharyngeal exudate, posterior oropharyngeal edema or posterior oropharyngeal erythema.       TMs normal bilaterally No TTP over sinuses Mild nasal congestion  Eyes: Conjunctivae and EOM are normal. Pupils are equal, round, and reactive to light.  Neck: Normal range of motion. Neck supple.  Cardiovascular: Normal rate and regular rhythm.   Murmur (II/VI SEM) heard. Pulmonary/Chest: Effort normal and breath sounds normal. No respiratory distress. He has no wheezes. He exhibits no tenderness.       + hacking cough  Abdominal: Soft. Bowel sounds are normal. He exhibits no distension. There is no tenderness. There is no rebound.  Lymphadenopathy:    He has no cervical adenopathy.  Skin: Skin is warm and dry.          Assessment & Plan:

## 2011-03-02 NOTE — Assessment & Plan Note (Signed)
New.  Likely contributing factor as pt's sxs improved temporarily w/ Zantac.  Restart Nexium.  Reviewed supportive care and red flags that should prompt return.  Pt expressed understanding and is in agreement w/ plan.

## 2011-03-02 NOTE — Assessment & Plan Note (Signed)
Pt's sxs consistent w/ infxn.  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  Reviewed supportive care and red flags that should prompt return.  Pt expressed understanding and is in agreement w/ plan.

## 2011-03-30 ENCOUNTER — Ambulatory Visit (INDEPENDENT_AMBULATORY_CARE_PROVIDER_SITE_OTHER): Payer: BC Managed Care – PPO | Admitting: Family Medicine

## 2011-03-30 ENCOUNTER — Encounter: Payer: Self-pay | Admitting: Family Medicine

## 2011-03-30 DIAGNOSIS — R0982 Postnasal drip: Secondary | ICD-10-CM

## 2011-03-30 NOTE — Assessment & Plan Note (Signed)
New.  Start OTC antihistamine to improve sxs.  Reviewed supportive care and red flags that should prompt return.  Pt expressed understanding and is in agreement w/ plan.

## 2011-03-30 NOTE — Progress Notes (Signed)
  Subjective:    Patient ID: [name], male    DOB: 08-10-1985, 26 y.o.   MRN: [id number]  HPI Sore throat- sxs started Friday.  Enough to wake from sleep.  No fevers.  Denies PND.  No ear pain.  No hx of seasonal allergies.     Review of Systems For ROS see HPI     Objective:   Physical Exam  Vitals reviewed. Constitutional: He appears well-developed and well-nourished. No distress.  HENT:  Head: Normocephalic and atraumatic.       No TTP over sinuses + turbinate edema + PND TMs normal bilaterally  Eyes: Conjunctivae and EOM are normal. Pupils are equal, round, and reactive to light.  Neck: Normal range of motion. Neck supple.  Cardiovascular: Normal rate, regular rhythm and normal heart sounds.   Pulmonary/Chest: Effort normal and breath sounds normal. No respiratory distress. He has no wheezes.  Lymphadenopathy:    He has no cervical adenopathy.  Skin: Skin is warm and dry.          Assessment & Plan:

## 2011-03-30 NOTE — Patient Instructions (Signed)
You have post nasal drip that is causing your sore throat Drink plenty of fluids Ibuprofen for pain Claritin or Zyrtec for post nasal drainage Non-drowsy dramamine for motion sickness Have a great trip!!!

## 2011-04-28 ENCOUNTER — Ambulatory Visit: Payer: BC Managed Care – PPO | Admitting: Family Medicine

## 2011-04-30 ENCOUNTER — Other Ambulatory Visit: Payer: BC Managed Care – PPO

## 2011-04-30 DIAGNOSIS — Z20828 Contact with and (suspected) exposure to other viral communicable diseases: Secondary | ICD-10-CM

## 2011-05-02 ENCOUNTER — Telehealth: Payer: Self-pay

## 2011-05-02 LAB — GC/CHLAMYDIA PROBE AMP, URINE: Chlamydia, Swab/Urine, PCR: NEGATIVE

## 2011-05-02 NOTE — Telephone Encounter (Signed)
letter mailed to patients home address with results.  

## 2011-05-02 NOTE — Telephone Encounter (Signed)
Msg from patient stating he recently discussed labs but would also like a copy mailed to him at home. Please advise    KP

## 2011-06-15 ENCOUNTER — Encounter: Payer: Self-pay | Admitting: Family Medicine

## 2011-06-15 ENCOUNTER — Telehealth: Payer: Self-pay | Admitting: *Deleted

## 2011-06-15 ENCOUNTER — Ambulatory Visit (INDEPENDENT_AMBULATORY_CARE_PROVIDER_SITE_OTHER): Payer: BC Managed Care – PPO | Admitting: Family Medicine

## 2011-06-15 VITALS — BP 118/78 | HR 58 | Temp 98.1°F | Wt 211.0 lb

## 2011-06-15 DIAGNOSIS — H109 Unspecified conjunctivitis: Secondary | ICD-10-CM

## 2011-06-15 MED ORDER — MOXIFLOXACIN HCL 0.5 % OP SOLN: 1.0000 [drp] | Freq: Three times a day (TID) | OPHTHALMIC | Status: AC

## 2011-06-15 NOTE — Progress Notes (Signed)
  Subjective:    Trayquan Kolakowski is a 26 y.o. male who presents for evaluation of discharge, erythema, itching and tearing in the left eye. He has noticed the above symptoms for 2 days. Onset was sudden, gradual. Patient denies blurred vision, foreign body sensation, pain, photophobia and visual field deficit. There is a history of contact lens use.  The following portions of the patient's history were reviewed and updated as appropriate: allergies, current medications, past family history, past medical history, past social history, past surgical history and problem list.  Review of Systems Pertinent items are noted in HPI.   Objective:    BP 118/78  Pulse 58  Temp(Src) 98.1 F (36.7 C) (Oral)  Wt 211 lb (95.709 kg)  SpO2 99%      General: alert, cooperative, appears stated age and no distress  Eyes:  negative findings: lids and lashes normal, positive findings: eyelids/periorbital: swelling L eye lid-- L eye injected with mucous d/c R eye tearing and injected  Vision: Not performed  Fluorescein:  not done     Assessment:    Acute conjunctivitis   Plan:    Discussed the diagnosis and proper care of conjunctivitis.  Stressed household Presenter, broadcasting. Ophthalmic drops per orders. Warm compress to eye(s). Local eye care discussed.  F/u ophth by Monday if no better  if worse tomorrow go to [facility] or [facility]

## 2011-06-15 NOTE — Patient Instructions (Signed)
Bacterial Conjunctivitis Conjunctivitis is an irritation (inflammation) of the clear membrane that covers the white part of the eye (conjunctiva). The irritation can also happen on the underside of the eyelids. Conjunctivitis makes the eye red or pink in color. This is what is commonly known as pink eye. CAUSES   Infection from a germ (bacteria) on the surface of the eye.   Infection from the irritation or injury of nearby tissues such as the eyelids or cornea.   More serious inflammation or infection on the inside of the eye.   Other eye diseases.   The use of certain eye medications.  SYMPTOMS  The normally white color of the eye or the underside of the eyelid is usually pink or red in color. The pink eye is usually associated with irritation, tearing and some sensitivity to light. Bacterial conjunctivitis is often associated with a thick, yellowish discharge from the eye. If a discharge is present, there may also be some blurred vision in the affected eye. DIAGNOSIS  Conjunctivitis is diagnosed by an eye exam. The eye specialist looks for changes in the surface tissues of the eye which take on changes that point to the specific type of conjunctivitis. A sample of any discharge may be collected on a Q-Tip (sterile swap). The sample will be sent to a lab to see whether or not the inflammation is caused by bacterial or viral infection. TREATMENT  Bacterial conjunctivitis is treated with medicines that kill germs (antibiotics). Drops are most often used. However, antibiotic ointments are available and may be preferred by some patients. Antibiotics by mouth (oral) are sometimes used. Artificial tears or eye washes may ease discomfort. HOME CARE INSTRUCTIONS   To ease discomfort, apply a cool, clean wash cloth to the eye for 10 to 20 minutes, 3 to 4 times a day.   Gently wipe away any drainage from the eye with a warm, wet washcloth or a cotton ball.   Wash your hands often with soap. Use paper  towels to dry.   Do not share towels or wash cloths. This may spread the infection.   Change or wash your pillow case every day.   You should not use eye make-up until the infection is gone.   Do not operate machinery or drive if vision is blurred.   Stop using contacts lenses. Ask your eye professional how to sterilize or replace them before using again. This depends on the type of contact lenses used.   Do not touch the edge of the eyelid with the eye drop bottle or ointment tube when applying medications to the affected eye. This will stop you from spreading the infection to the other eye or to others. Do as your caregiver tell you.  SEEK IMMEDIATE MEDICAL CARE IF:   The infection has not improved within 3 days of beginning treatment.   A yellow discharge from the eye develops.   Pain in the eye increases.   The redness is spreading.   Vision becomes blurred.   An oral temperature above 102 F (38.9 C) develops, or as your caregiver suggests.   Facial pain, redness or swelling develops.   Any problems that may be related to the prescribed medicine develops.  MAKE SURE YOU:   Understand these instructions.   Will watch your condition.   Will get help right away if you are not doing well or get worse.  Document Released: 01/08/2005 Document Revised: 12/28/2010 Document Reviewed: 08/28/2007 [employer] Patient Information 2012 [employer]. 

## 2011-06-15 NOTE — Telephone Encounter (Signed)
Pt seen today

## 2011-06-15 NOTE — Telephone Encounter (Signed)
Per CAN e-mail: caller Patient calling regarding eye swelling; Pt awake this AM, 06-14-11, and left eye is almost completely swollen and lids look red. Also with dark green discharge. No fever. No increased pain. RN reached see in 4 hrs for eye discharge with swelling per Eye; Infection or Irritation protocol--Appt schedule today with Dr Laury Axon at 11:00.P

## 2011-06-18 ENCOUNTER — Ambulatory Visit (INDEPENDENT_AMBULATORY_CARE_PROVIDER_SITE_OTHER): Payer: BC Managed Care – PPO | Admitting: Physician Assistant

## 2011-06-18 VITALS — BP 118/84 | HR 72 | Temp 98.3°F | Resp 16 | Ht 69.18 in | Wt 207.2 lb

## 2011-06-18 DIAGNOSIS — H109 Unspecified conjunctivitis: Secondary | ICD-10-CM

## 2011-06-18 DIAGNOSIS — H1045 Other chronic allergic conjunctivitis: Secondary | ICD-10-CM

## 2011-06-18 DIAGNOSIS — H1010 Acute atopic conjunctivitis, unspecified eye: Secondary | ICD-10-CM

## 2011-06-18 MED ORDER — AZELASTINE HCL 0.05 % OP SOLN: 1.0000 [drp] | Freq: Two times a day (BID) | OPHTHALMIC | Status: DC

## 2011-06-18 MED ORDER — POLYMYXIN B-TRIMETHOPRIM 10000-0.1 UNIT/ML-% OP SOLN: 1.0000 [drp] | OPHTHALMIC | Status: AC

## 2011-06-18 NOTE — Progress Notes (Signed)
  Subjective:    Patient ID: [name], male    DOB: May 01, 1985, 26 y.o.   MRN: [id number]  HPI Patient presents for evaluation of conjunctivitis. His symptoms started 06/14/11 as mild redness. States he woke up the next morning and had periorbital swelling and increasing redness so went to see another doctor and was treated for bacterial conjunctivitis with Vigamox. Patient states that his symptoms are not improving and in fact are worsening and have spread to the right eye as well.  He denies rhinorrhea, congestion, otalgia, sore throat, fever, chills. Has mild bilateral eye irritation and foreign body sensation. Complains of thick, green/yellow discharge from both eyes. Does admit to a history of allergic rhinitis treated occasionally with OTC Allegra.     Review of Systems  Constitutional: Negative for fever and chills.  HENT: Negative for ear pain and rhinorrhea.   Eyes: Positive for pain (irritation), discharge and redness. Negative for photophobia and visual disturbance.  Respiratory: Negative for cough.   Skin: Negative for rash.  Neurological: Negative for dizziness, light-headedness and headaches.       Objective:   Physical Exam  Constitutional: He is oriented to person, place, and time. He appears well-developed and well-nourished.  HENT:  Head: Normocephalic and atraumatic.  Right Ear: External ear normal.  Left Ear: External ear normal.  Mouth/Throat: Oropharynx is clear and moist. No oropharyngeal exudate.  Eyes: EOM are normal. Pupils are equal, round, and reactive to light. Right eye exhibits discharge. Left eye exhibits discharge. Right conjunctiva is injected. Left conjunctiva is injected.  Neck: Neck supple.  Cardiovascular: Normal rate, regular rhythm and normal heart sounds.   Pulmonary/Chest: Effort normal and breath sounds normal.  Musculoskeletal: Normal range of motion.  Lymphadenopathy:    He has no cervical adenopathy.  Neurological: He is alert and  oriented to person, place, and time.  Skin: Skin is warm and dry.  Psychiatric: He has a normal mood and affect. His behavior is normal. Judgment and thought content normal.          Assessment & Plan:   1. Allergic conjunctivitis  Likely an allergic component to this. Try azelastine eye drops to help with gritty sensation and irritation azelastine (OPTIVAR) 0.05 % ophthalmic solution  2. Conjunctivitis  Change Vigamox to Polytrim and use as directed.  Recommend follow up with ophthalmologist if no improvement in 48 hours, or worsening before then.  trimethoprim-polymyxin b (POLYTRIM) ophthalmic solution

## 2011-06-20 ENCOUNTER — Telehealth: Payer: Self-pay | Admitting: Family Medicine

## 2011-06-20 NOTE — Telephone Encounter (Signed)
Caller: [place]/Patient; PCP: Sheliah Hatch.; CB#: [phone];  Calling today 06/20/11 regarding Conjunctivitis not clearing after several treatments; was seen on 06/15/11 in office and also went to urgent care on Monday 06/18/11.  Pt was on Vigamox eye drops, then changed to polyrim eye drops, and also allergy drops.  Afebrile.  Having yellow discharge to both eyes.  Emergent symptoms r/o by Eye Infectin or Irritation guidelines with exception of evaluated by provider and not responding to 24 hours of home care.  Offered to schedule appt for pt today at office with Dr. Alwyn Ren (Dr. Beverely Low does not have any appts left for today).  Pt delcined.  Said he would rather wait and see Dr. Beverely Low tomorrow.  Transferred call to [facility] in office to schedule a next day appt.

## 2011-06-20 NOTE — Telephone Encounter (Signed)
noted 

## 2011-06-20 NOTE — Telephone Encounter (Signed)
Noted pt has an apt with MD Beverely Low tomorrow 06-21-11

## 2011-06-20 NOTE — Telephone Encounter (Signed)
[facility] Triage Call Report Triage Record Num: [id number] Operator: Baldomero Lamy Patient Name: [name] Call Date & Time: 06/16/2011 9:21:24AM Patient Phone: [phone] PCP: Lezlie Octave Patient Gender: Male PCP Fax : [phone] Patient DOB: 04-15-1985 Practice Name: [facility] Reason for Call: Caller: [place]/Patient; PCP: Sheliah Hatch.; CB#: [phone]; Call regarding Eye Redness; Pt calling regarding conitinued bilateral eye redness after starting Vigamox less than 24 hrs ago for conjuctivitis. Saw Dr. Laury Axon on 5/24. Pt afebrile, feels fine and denies any visual changes. Discussed conjunctivitis expected length, transmission and when sxs should be better. Pt had no further questions. Call back parameters given. Protocol(s) Used: Information Only Call; No Symptom Triage (Adult) Recommended Outcome per Protocol: Provide Information or Advice Only Reason for Outcome: Follow-up call to recent contact; no triage required. Information provided from past call documentation, approved references or experience. Care Advice:

## 2011-06-21 ENCOUNTER — Ambulatory Visit (INDEPENDENT_AMBULATORY_CARE_PROVIDER_SITE_OTHER): Payer: BC Managed Care – PPO | Admitting: Family Medicine

## 2011-06-21 ENCOUNTER — Encounter: Payer: Self-pay | Admitting: Family Medicine

## 2011-06-21 VITALS — BP 129/84 | HR 71 | Temp 98.2°F | Ht 69.5 in | Wt 211.6 lb

## 2011-06-21 DIAGNOSIS — H109 Unspecified conjunctivitis: Secondary | ICD-10-CM | POA: Insufficient documentation

## 2011-06-21 NOTE — Patient Instructions (Signed)
Let me know what the eye doctor says! Hang in there!! Have a great summer!

## 2011-06-21 NOTE — Progress Notes (Signed)
Precepted with Ms. Marte, PA-C and agree.  

## 2011-06-21 NOTE — Progress Notes (Signed)
  Subjective:    Patient ID: [name], male    DOB: 1985-07-10, 26 y.o.   MRN: [id number]  HPI Conjunctivitis- was seen on Friday, started on Vigamox.  Went to UC and was switched to Polytrim and antihistamine eye drops.  Eyes are continuing to drain- drainage is thin and clear, but has odor.  Will dry to yellow crust.  Minimal itching.  + gritty sensation, discomfort.  + blurry vision when draining.   Review of Systems For ROS see HPI     Objective:   Physical Exam  Vitals reviewed. Constitutional: He appears well-developed and well-nourished. No distress.  HENT:  Head: Normocephalic and atraumatic.  Nose: Nose normal.  Mouth/Throat: Oropharynx is clear and moist. No oropharyngeal exudate.       TMs normal bilaterally No TTP over sinuses  Eyes: EOM are normal. Pupils are equal, round, and reactive to light.       Severe conjunctival injxn w/ limbic sparing, clear tearing  Neck: Neck supple.  Lymphadenopathy:    He has no cervical adenopathy.          Assessment & Plan:

## 2011-06-21 NOTE — Assessment & Plan Note (Signed)
New to provider.  This is pt's 3rd visit for same concern w/out relief from 3 previous meds.  Due to severity of sxs and lack of improvement will refer to ophtho.  Pt expressed understanding and is in agreement w/ plan.

## 2011-07-25 ENCOUNTER — Ambulatory Visit: Payer: BC Managed Care – PPO | Admitting: Family Medicine

## 2011-09-18 ENCOUNTER — Ambulatory Visit (INDEPENDENT_AMBULATORY_CARE_PROVIDER_SITE_OTHER): Payer: BC Managed Care – PPO | Admitting: Family Medicine

## 2011-09-18 ENCOUNTER — Encounter: Payer: Self-pay | Admitting: Family Medicine

## 2011-09-18 VITALS — BP 121/78 | HR 64 | Temp 98.3°F | Ht 70.25 in | Wt 207.2 lb

## 2011-09-18 DIAGNOSIS — Z20828 Contact with and (suspected) exposure to other viral communicable diseases: Secondary | ICD-10-CM

## 2011-09-18 DIAGNOSIS — Z0000 Encounter for general adult medical examination without abnormal findings: Secondary | ICD-10-CM

## 2011-09-18 LAB — LIPID PANEL
Cholesterol: 126 mg/dL (ref 0–200)
Total CHOL/HDL Ratio: 3
Triglycerides: 62 mg/dL (ref 0.0–149.0)

## 2011-09-18 LAB — CBC WITH DIFFERENTIAL/PLATELET
Basophils Absolute: 0 10*3/uL (ref 0.0–0.1)
Eosinophils Absolute: 0 10*3/uL (ref 0.0–0.7)
Hemoglobin: 14.9 g/dL (ref 13.0–17.0)
Lymphocytes Relative: 33 % (ref 12.0–46.0)
MCHC: 32.8 g/dL (ref 30.0–36.0)
Neutro Abs: 2.2 10*3/uL (ref 1.4–7.7)
Platelets: 180 10*3/uL (ref 150.0–400.0)
RDW: 12.8 % (ref 11.5–14.6)

## 2011-09-18 LAB — BASIC METABOLIC PANEL
Calcium: 9.3 mg/dL (ref 8.4–10.5)
Chloride: 106 mEq/L (ref 96–112)
Creatinine, Ser: 1.1 mg/dL (ref 0.4–1.5)

## 2011-09-18 LAB — HEPATIC FUNCTION PANEL
ALT: 15 U/L (ref 0–53)
Bilirubin, Direct: 0 mg/dL (ref 0.0–0.3)
Total Bilirubin: 0.7 mg/dL (ref 0.3–1.2)

## 2011-09-18 MED ORDER — TETANUS-DIPHTH-ACELL PERTUSSIS 5-2.5-18.5 LF-MCG/0.5 IM SUSP
0.5000 mL | Freq: Once | INTRAMUSCULAR | Status: AC
  Administered 2011-09-18: 0.5 mL via INTRAMUSCULAR

## 2011-09-18 NOTE — Progress Notes (Signed)
  Subjective:    Patient ID: [name], male    DOB: November 09, 1985, 26 y.o.   MRN: [id number]  HPI CPE- no concerns today.   Review of Systems Patient reports no vision/hearing changes, anorexia, fever ,adenopathy, persistant/recurrent hoarseness, swallowing issues, chest pain, palpitations, edema, persistant/recurrent cough, hemoptysis, dyspnea (rest,exertional, paroxysmal nocturnal), gastrointestinal  bleeding (melena, rectal bleeding), abdominal pain, excessive heart burn, GU symptoms (dysuria, hematuria, voiding/incontinence issues) syncope, focal weakness, memory loss, numbness & tingling, skin/hair/nail changes, depression, anxiety, abnormal bruising/bleeding, musculoskeletal symptoms/signs.     Objective:   Physical Exam BP 121/78  Pulse 64  Temp 98.3 F (36.8 C) (Oral)  Ht 5' 10.25" (1.784 m)  Wt 207 lb 3.2 oz (93.985 kg)  BMI 29.52 kg/m2  SpO2 97%  General Appearance:    Alert, cooperative, no distress, appears stated age  Head:    Normocephalic, without obvious abnormality, atraumatic  Eyes:    PERRL, conjunctiva/corneas clear, EOM's intact, fundi    benign, both eyes       Ears:    Normal TM's and external ear canals, both ears  Nose:   Nares normal, septum midline, mucosa normal, no drainage   or sinus tenderness  Throat:   Lips, mucosa, and tongue normal; teeth and gums normal  Neck:   Supple, symmetrical, trachea midline, no adenopathy;       thyroid:  No enlargement/tenderness/nodules  Back:     Symmetric, no curvature, ROM normal, no CVA tenderness  Lungs:     Clear to auscultation bilaterally, respirations unlabored  Chest wall:    No tenderness or deformity  Heart:    Regular rate and rhythm, S1 and S2 normal, no murmur, rub   or gallop  Abdomen:     Soft, non-tender, bowel sounds active all four quadrants,    no masses, no organomegaly  Genitalia:    Normal male without lesion, discharge or tenderness  Rectal:    Deferred due to young age  Extremities:    Extremities normal, atraumatic, no cyanosis or edema  Pulses:   2+ and symmetric all extremities  Skin:   Skin color, texture, turgor normal, no rashes or lesions  Lymph nodes:   Cervical, supraclavicular, and axillary nodes normal  Neurologic:   CNII-XII intact. Normal strength, sensation and reflexes      throughout          Assessment & Plan:

## 2011-09-18 NOTE — Patient Instructions (Addendum)
Follow up in 1 year or as needed Keep up the good work!  You look great! We'll notify you of your lab results Call with any questions or concerns Hang in there w/ work!!

## 2011-09-18 NOTE — Assessment & Plan Note (Signed)
Pt's PE WNL.  Check labs. Tdap updated.  Anticipatory guidance provided.  

## 2011-09-18 NOTE — Assessment & Plan Note (Signed)
Check labs at pt's request 

## 2011-09-19 ENCOUNTER — Encounter: Payer: Self-pay | Admitting: *Deleted

## 2011-09-19 LAB — GC/CHLAMYDIA PROBE AMP, URINE
Chlamydia, Swab/Urine, PCR: NEGATIVE
GC Probe Amp, Urine: NEGATIVE

## 2011-09-19 LAB — HIV ANTIBODY (ROUTINE TESTING W REFLEX): HIV: NONREACTIVE

## 2011-09-19 LAB — RPR

## 2011-10-09 ENCOUNTER — Other Ambulatory Visit: Payer: Self-pay | Admitting: Occupational Medicine

## 2011-10-09 ENCOUNTER — Ambulatory Visit
Admission: RE | Admit: 2011-10-09 | Discharge: 2011-10-09 | Disposition: A | Discharge status: Home / Self Care | Payer: No Typology Code available for payment source | Source: Ambulatory Visit | Attending: Occupational Medicine | Admitting: Occupational Medicine

## 2011-10-09 DIAGNOSIS — Z021 Encounter for pre-employment examination: Secondary | ICD-10-CM

## 2012-03-08 ENCOUNTER — Other Ambulatory Visit: Payer: Self-pay

## 2012-07-15 ENCOUNTER — Encounter (HOSPITAL_COMMUNITY): Payer: Self-pay | Admitting: *Deleted

## 2012-07-15 ENCOUNTER — Emergency Department (HOSPITAL_COMMUNITY)
Admission: EM | Admit: 2012-07-15 | Discharge: 2012-07-15 | Disposition: A | Discharge status: Home / Self Care | Payer: 59 | Source: Home / Self Care | Attending: Family Medicine | Admitting: Family Medicine

## 2012-07-15 DIAGNOSIS — N200 Calculus of kidney: Secondary | ICD-10-CM

## 2012-07-15 LAB — POCT URINALYSIS DIP (DEVICE)
Glucose, UA: NEGATIVE mg/dL
Leukocytes, UA: NEGATIVE
Nitrite: NEGATIVE
Protein, ur: NEGATIVE mg/dL
Specific Gravity, Urine: 1.015 (ref 1.005–1.030)
Urobilinogen, UA: 0.2 mg/dL (ref 0.0–1.0)

## 2012-07-15 NOTE — ED Notes (Signed)
Pt  Reports  yest he  Noticed    Some  Discomfort   At the  End  Of  His  Urinary  Stream    He    Reports  Some  Pinkish    Discoloration as   Well    He  denys  Any  Discharge

## 2012-07-15 NOTE — ED Provider Notes (Signed)
   History    CSN: [id number] Arrival date & time 07/15/12  1734  First MD Initiated Contact with Patient 07/15/12 1759     Chief Complaint  Patient presents with  . Dysuria   (Consider location/radiation/quality/duration/timing/severity/associated sxs/prior Treatment) Patient is a 27 y.o. male presenting with dysuria. The history is provided by the patient. No language interpreter was used.  Dysuria This is a new problem. The current episode started yesterday. Episode frequency: Patient's dysuria started yesterday afternoon. His numbness episode this afternoon associated with hematuria. The problem has been gradually worsening. Associated symptoms include abdominal pain. Pertinent negatives include no chest pain, no headaches and no shortness of breath. Associated symptoms comments: Associated symptoms include urinary frequency, urgency and one episode of hematuria. Patient is actually active with 2 women and uses condoms inconsistently. He denies urethral discharge. He is circumcised. He denies history of STI or UTI.Marland Kitchen He has tried nothing for the symptoms.   Past Medical History  Diagnosis Date  . Heart murmur    Past Surgical History  Procedure Laterality Date  . Cardiac catheterization      7th grade, pt not sure what prompted procedure  . Doppler echocardiography  2010    normal   Family History  Problem Relation Age of Onset  . Hypertension Mother   . Hypertension Father   . Diabetes      grandmother   History  Substance Use Topics  . Smoking status: Never Smoker   . Smokeless tobacco: Never Used  . Alcohol Use: No    Review of Systems  Respiratory: Negative for shortness of breath.   Cardiovascular: Negative for chest pain.  Gastrointestinal: Positive for abdominal pain.  Genitourinary: Positive for dysuria.  Neurological: Negative for headaches.    Allergies  Review of patient's allergies indicates no known allergies.  Home Medications   Current Outpatient  Rx  Name  Route  Sig  Dispense  Refill  . ranitidine (ZANTAC) 75 MG tablet   Oral   Take 75 mg by mouth 2 (two) times daily as needed.           BP 123/58  Pulse 64  Temp(Src) 98.8 F (37.1 C) (Oral)  Resp 18  SpO2 100% Physical Exam  Constitutional: He appears well-developed and well-nourished. No distress.  Abdominal: There is tenderness in the right lower quadrant. There is CVA tenderness. There is no rigidity, no rebound and no guarding. No hernia.  Genitourinary: Testes normal.    Circumcised. No penile erythema or penile tenderness. No discharge found.    ED Course  Procedures (including critical care time) Labs Reviewed - No data to display No results found. No diagnosis found.  MDM  Well-appearing 27 year old male with dysuria hematuria. Differentials include UTI, urethritis and kidney stone.  Awaiting clean-catch urinalysis.   clean-catch urinalysis was normal without evidence of infection or hematuria.  Given history and clinical exam I suspect the patient has passed a small kidney stone.  Advised the patient to call for urology followup in the morning. Have also advised the patient to increase intake of fluids.   Dessa Phi, MD 07/15/12 [facility] Moment

## 2012-07-16 ENCOUNTER — Telehealth: Payer: Self-pay | Admitting: Family Medicine

## 2012-07-16 ENCOUNTER — Telehealth: Payer: Self-pay | Admitting: *Deleted

## 2012-07-16 NOTE — Telephone Encounter (Signed)
Patient states that he spoke to the on-call nurse yesterday after hours because he was having symptoms of a UTI. Says that the on-call nurse advised that he go to the urgent care to be seen. Patient states that he went to [facility] urgent care and they advised him to call [facility] to schedule an appointment to see a specialist but patient says when he called today he was told that he would be put on a list for scheduling. Patient is concerned that this process may take too long and wants to know if we can refer him and set up an appointment sooner than he can get by him calling.

## 2012-07-16 NOTE — Telephone Encounter (Signed)
[facility] Triage Call Report Triage Record Num: [id number] Operator: Tomasita Crumble Patient Name: [name] Call Date & Time: 07/15/2012 5:04:30PM Patient Phone: [phone] PCP: Lezlie Octave Patient Gender: Male PCP Fax : [phone] Patient DOB: Jul 13, 1985 Practice Name: [facility] Reason for Call: Caller: Renell/Patient; PCP: Sheliah Hatch.; CB#: [phone]; Call regarding Urinary Pain/Bleeding; Reports he has pain at end of urination and urine has pinkish tinge to it. Urgency. Onset symptoms 07/14/12. Afebrile/subjective. Lower abdominal pain reported. Emergent symptoms ruled out. Home care for the interim and parameters for callback given. See Provider within 4 hours per Bloody Urine protocol due to "Urinary tract symptoms AND any flankd, low back, lower abdominal or genital area pain." Caller agreed to go to [facility] for evaluation. Protocol(s) Used: Bloody Urine Protocol(s) Used: Urinary Symptoms - Male Recommended Outcome per Protocol: See Provider within 4 hours Reason for Outcome: Blood in urine Urinary tract symptoms AND any flank, low back, lower abdominal or genital area (labia, vagina OR testicle/scrotum) pain Care Advice: ~ Another adult should drive. ~ List, or take, all current prescription(s), nonprescription or alternative medication(s) to provider for evaluation. Systemic Inflammatory Response Syndrome (SIRS): Watch for signs of a generalized, whole body infection. Occurs within days of a localized infection, especially of the urinary, GI, respiratory or nervous systems; or after a traumatic injury or invasive procedure. - Call EMS 911 if symptoms have worsened, such as increasing confusion or unusual drowsiness; cold and clammy skin; no urine output; rapid respiration (>30/min.) or slow respiration (<10/min.); struggling to breathe. - Go to the ED immediately for early symptoms of rapid pulse >90/min. or rapid breathing >20/min.  at rest; chills; oral temperature >100.4 F (38 C) or <96.8 F (36 C) when associated with conditions noted

## 2012-07-17 ENCOUNTER — Telehealth: Payer: Self-pay | Admitting: *Deleted

## 2012-07-17 DIAGNOSIS — N200 Calculus of kidney: Secondary | ICD-10-CM

## 2012-07-17 NOTE — Telephone Encounter (Signed)
Urology referral placed and pt notified.

## 2012-07-17 NOTE — Telephone Encounter (Signed)
Do you want to place a urology referral for this?

## 2012-07-17 NOTE — Telephone Encounter (Signed)
Ok for referral to urology- dx kidney stone

## 2012-07-17 NOTE — Telephone Encounter (Signed)
Urology order placed per MD written order.

## 2012-07-29 NOTE — ED Provider Notes (Signed)
Medical screening examination/treatment/procedure(s) were performed by resident physician or non-physician practitioner and as supervising physician I was immediately available for consultation/collaboration.   Casady Voshell DOUGLAS MD.   Suhey Radford D Murdis Flitton, MD 07/29/12 0850 

## 2012-09-19 ENCOUNTER — Encounter: Payer: Self-pay | Admitting: Family Medicine

## 2012-09-19 ENCOUNTER — Ambulatory Visit (INDEPENDENT_AMBULATORY_CARE_PROVIDER_SITE_OTHER): Payer: 59 | Admitting: Family Medicine

## 2012-09-19 VITALS — BP 128/82 | HR 64 | Temp 98.2°F | Ht 70.25 in | Wt 210.4 lb

## 2012-09-19 DIAGNOSIS — Z0000 Encounter for general adult medical examination without abnormal findings: Secondary | ICD-10-CM

## 2012-09-19 DIAGNOSIS — Z20828 Contact with and (suspected) exposure to other viral communicable diseases: Secondary | ICD-10-CM

## 2012-09-19 LAB — CBC WITH DIFFERENTIAL/PLATELET
Basophils Relative: 0.6 % (ref 0.0–3.0)
Eosinophils Relative: 1.4 % (ref 0.0–5.0)
Lymphocytes Relative: 32.2 % (ref 12.0–46.0)
MCV: 86.6 fl (ref 78.0–100.0)
Monocytes Absolute: 0.4 10*3/uL (ref 0.1–1.0)
Neutrophils Relative %: 55.4 % (ref 43.0–77.0)
Platelets: 205 10*3/uL (ref 150.0–400.0)
RBC: 5.29 Mil/uL (ref 4.22–5.81)
WBC: 4.2 10*3/uL — ABNORMAL LOW (ref 4.5–10.5)

## 2012-09-19 LAB — HEPATIC FUNCTION PANEL
ALT: 18 U/L (ref 0–53)
AST: 19 U/L (ref 0–37)
Albumin: 4.4 g/dL (ref 3.5–5.2)
Alkaline Phosphatase: 50 U/L (ref 39–117)
Total Bilirubin: 0.6 mg/dL (ref 0.3–1.2)

## 2012-09-19 LAB — BASIC METABOLIC PANEL
BUN: 12 mg/dL (ref 6–23)
Chloride: 104 mEq/L (ref 96–112)
GFR: 104.49 mL/min (ref >60.00)
Glucose, Bld: 80 mg/dL (ref 70–99)
Potassium: 3.3 mEq/L — ABNORMAL LOW (ref 3.5–5.1)
Sodium: 139 mEq/L (ref 135–145)

## 2012-09-19 LAB — LIPID PANEL
HDL: 48.4 mg/dL (ref >39.00)
Total CHOL/HDL Ratio: 3
VLDL: 14 mg/dL (ref 0.0–40.0)

## 2012-09-19 NOTE — Patient Instructions (Addendum)
Follow up in 1 year or as needed We'll notify you of your lab results and make any changes if needed Keep up the good work!  You look great! Call with any questions or concerns Happy Labor Day!!! 

## 2012-09-19 NOTE — Assessment & Plan Note (Signed)
Pt's PE WNL.  Check labs.  Anticipatory guidance provided.  

## 2012-09-19 NOTE — Assessment & Plan Note (Signed)
Check labs at pt's request 

## 2012-09-19 NOTE — Progress Notes (Signed)
  Subjective:    Patient ID: [name], male    DOB: 1985/03/05, 27 y.o.   MRN: [id number]  HPI CPE- no concerns   Review of Systems Patient reports no vision/hearing changes, anorexia, fever ,adenopathy, persistant/recurrent hoarseness, swallowing issues, chest pain, palpitations, edema, persistant/recurrent cough, hemoptysis, dyspnea (rest,exertional, paroxysmal nocturnal), gastrointestinal  bleeding (melena, rectal bleeding), abdominal pain, excessive heart burn, GU symptoms (dysuria, hematuria, voiding/incontinence issues) syncope, focal weakness, memory loss, numbness & tingling, skin/hair/nail changes, depression, anxiety, abnormal bruising/bleeding, musculoskeletal symptoms/signs.     Objective:   Physical Exam BP 128/82  Pulse 64  Temp(Src) 98.2 F (36.8 C) (Oral)  Ht 5' 10.25" (1.784 m)  Wt 210 lb 6.4 oz (95.437 kg)  BMI 29.99 kg/m2  SpO2 98%  General Appearance:    Alert, cooperative, no distress, appears stated age  Head:    Normocephalic, without obvious abnormality, atraumatic  Eyes:    PERRL, conjunctiva/corneas clear, EOM's intact, fundi    benign, both eyes       Ears:    Normal TM's and external ear canals, both ears  Nose:   Nares normal, septum midline, mucosa normal, no drainage   or sinus tenderness  Throat:   Lips, mucosa, and tongue normal; teeth and gums normal  Neck:   Supple, symmetrical, trachea midline, no adenopathy;       thyroid:  No enlargement/tenderness/nodules  Back:     Symmetric, no curvature, ROM normal, no CVA tenderness  Lungs:     Clear to auscultation bilaterally, respirations unlabored  Chest wall:    No tenderness or deformity  Heart:    Regular rate and rhythm, S1 and S2 normal, II/VI SEM, no rub   or gallop  Abdomen:     Soft, non-tender, bowel sounds active all four quadrants,    no masses, no organomegaly  Genitalia:    Deferred to urology  Rectal:    Extremities:   Extremities normal, atraumatic, no cyanosis or edema   Pulses:   2+ and symmetric all extremities  Skin:   Skin color, texture, turgor normal, no rashes or lesions  Lymph nodes:   Cervical, supraclavicular, and axillary nodes normal  Neurologic:   CNII-XII intact. Normal strength, sensation and reflexes      throughout          Assessment & Plan:

## 2012-09-23 LAB — HSV(HERPES SMPLX)ABS-I+II(IGG+IGM)-BLD
HSV 1 Glycoprotein G Ab, IgG: 1.56 IV — ABNORMAL HIGH
HSV 2 Glycoprotein G Ab, IgG: 0.14 IV

## 2012-11-12 IMAGING — CR DG CHEST 1V
1 series · 1 of 1 positions shown · non-contrast
Comparison: None.

CLINICAL DATA: Pre-employment physical.

CHEST - 1 VIEW

[view not recorded]
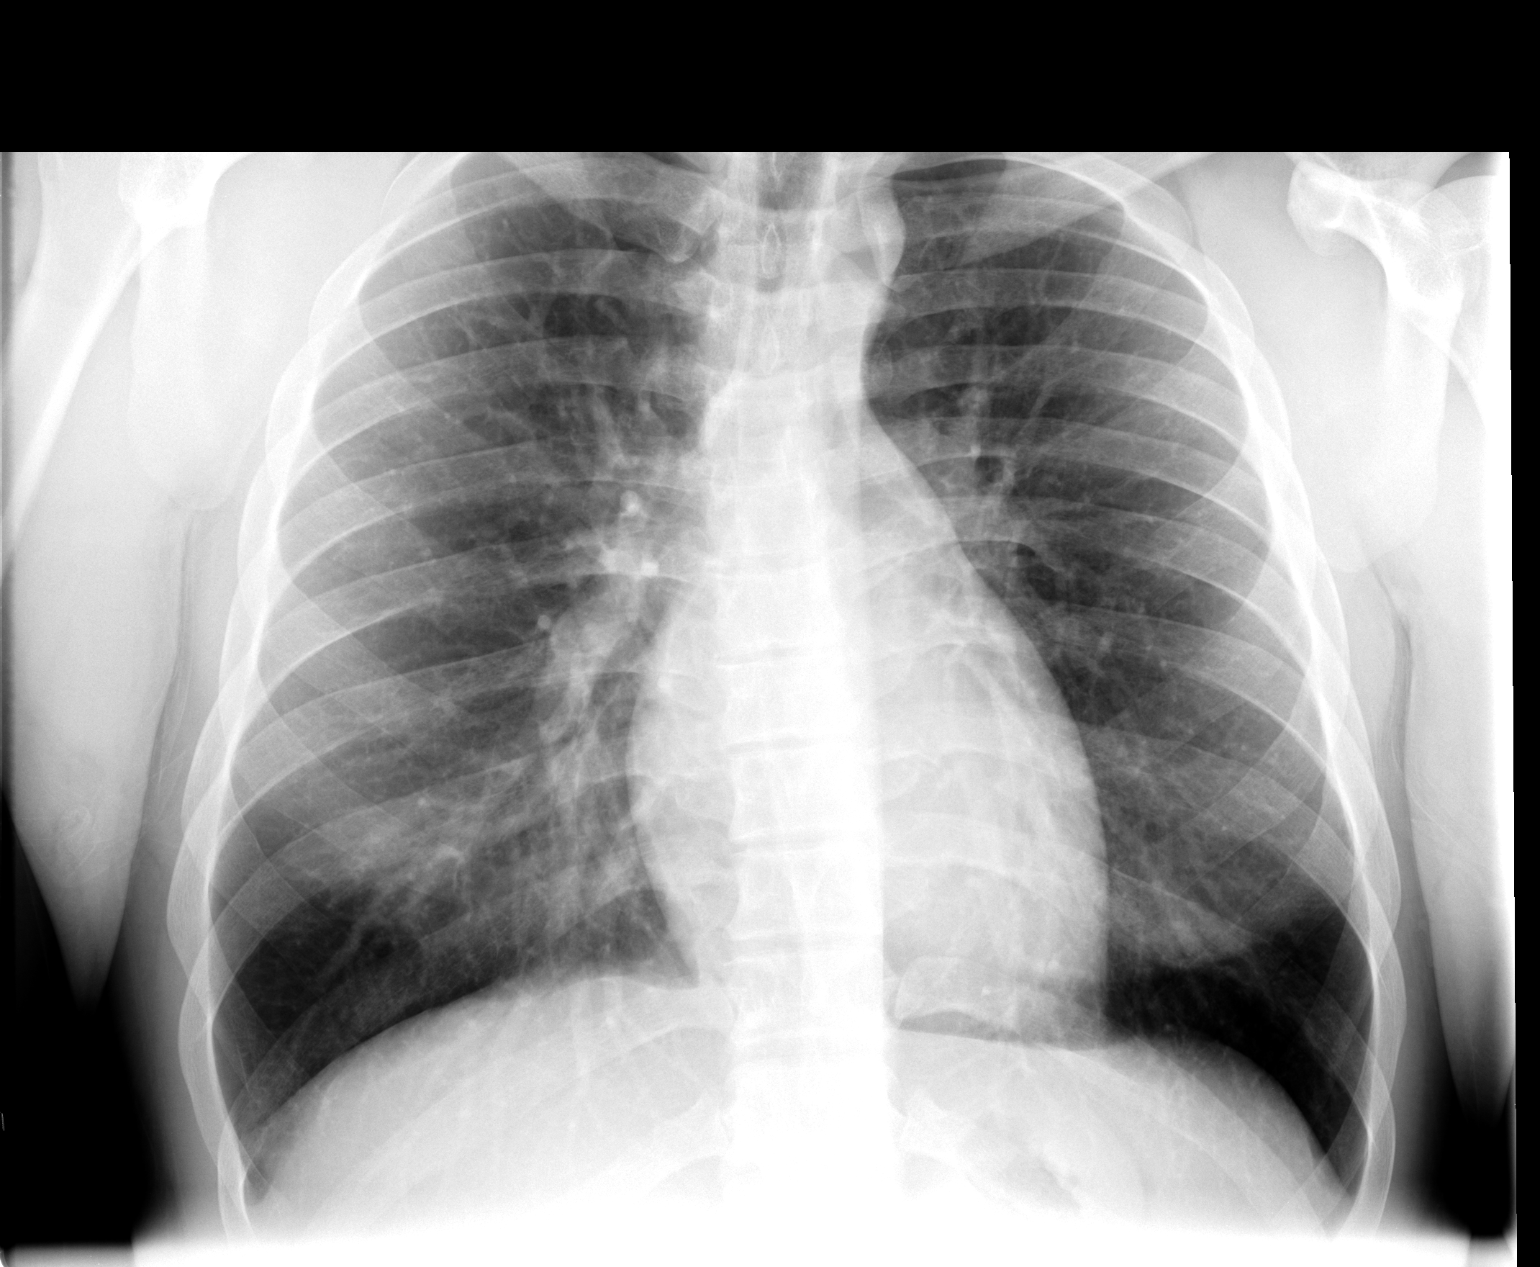

[1 of 1 positions shown; findings below may reference images not displayed]

FINDINGS: Cardiopericardial silhouette within normal limits.
Mediastinal contours normal. Trachea midline.  No airspace disease
or effusion.
IMPRESSION: Normal single view chest.

## 2012-11-27 ENCOUNTER — Other Ambulatory Visit: Payer: Self-pay

## 2013-09-14 ENCOUNTER — Telehealth: Payer: Self-pay | Admitting: Family Medicine

## 2013-09-14 NOTE — Telephone Encounter (Signed)
Caller name: [name]  Relation to pt: self Call back number : [phone]  Reason for call:  pt requesting a form for work to be filled out can not wait until physical appointment 11/30/13. Pt did not want to go in to details pt wanted to speak with nurse or doctor. I informed pt he was also put on a wait list regarding a sooner appointment

## 2013-09-15 NOTE — Telephone Encounter (Signed)
Pt was seen almost a year ago. If we can use the date of 09/19/12 pt can just bring in the forms. If the appt has to be after October pt can b scheduled in 15 minutes.

## 2013-10-19 ENCOUNTER — Ambulatory Visit (INDEPENDENT_AMBULATORY_CARE_PROVIDER_SITE_OTHER): Payer: 59 | Admitting: Family Medicine

## 2013-10-19 ENCOUNTER — Encounter: Payer: Self-pay | Admitting: Family Medicine

## 2013-10-19 VITALS — BP 110/72 | HR 62 | Temp 98.5°F | Resp 16 | Ht 70.0 in | Wt 237.4 lb

## 2013-10-19 DIAGNOSIS — Z0000 Encounter for general adult medical examination without abnormal findings: Secondary | ICD-10-CM

## 2013-10-19 NOTE — Assessment & Plan Note (Signed)
Pt's PE WNL.  UTD on urology exam.  Check labs.  Anticipatory guidance provided.

## 2013-10-19 NOTE — Progress Notes (Signed)
Pre visit review using our clinic review tool, if applicable. No additional management support is needed unless otherwise documented below in the visit note. 

## 2013-10-19 NOTE — Progress Notes (Signed)
   Subjective:    Patient ID: [name], male    DOB: [birth year], 28 y.o.   MRN: [id number]  HPI CPE- no concerns.   Review of Systems Patient reports no vision/hearing changes, anorexia, fever ,adenopathy, persistant/recurrent hoarseness, swallowing issues, chest pain, palpitations, edema, persistant/recurrent cough, hemoptysis, dyspnea (rest,exertional, paroxysmal nocturnal), gastrointestinal  bleeding (melena, rectal bleeding), abdominal pain, excessive heart burn, GU symptoms (dysuria, hematuria, voiding/incontinence issues) syncope, focal weakness, memory loss, numbness & tingling, skin/hair/nail changes, depression, anxiety, abnormal bruising/bleeding, musculoskeletal symptoms/signs.     Objective:   Physical Exam General Appearance:    Alert, cooperative, no distress, appears stated age  Head:    Normocephalic, without obvious abnormality, atraumatic  Eyes:    PERRL, conjunctiva/corneas clear, EOM's intact, fundi    benign, both eyes       Ears:    Normal TM's and external ear canals, both ears  Nose:   Nares normal, septum midline, mucosa normal, no drainage   or sinus tenderness  Throat:   Lips, mucosa, and tongue normal; teeth and gums normal  Neck:   Supple, symmetrical, trachea midline, no adenopathy;       thyroid:  No enlargement/tenderness/nodules  Back:     Symmetric, no curvature, ROM normal, no CVA tenderness  Lungs:     Clear to auscultation bilaterally, respirations unlabored  Chest wall:    No tenderness or deformity  Heart:    Regular rate and rhythm, S1 and S2 normal, no murmur, rub   or gallop  Abdomen:     Soft, non-tender, bowel sounds active all four quadrants,    no masses, no organomegaly  Genitalia:    Deferred to Urology  Rectal:    Extremities:   Extremities normal, atraumatic, no cyanosis or edema  Pulses:   2+ and symmetric all extremities  Skin:   Skin color, texture, turgor normal, no rashes or lesions  Lymph nodes:   Cervical,  supraclavicular, and axillary nodes normal  Neurologic:   CNII-XII intact. Normal strength, sensation and reflexes      throughout          Assessment & Plan:

## 2013-10-19 NOTE — Patient Instructions (Signed)
Follow up in 1 year or as needed We'll notify you of your lab results and make any changes if needed Keep up the good work!  You look great! Call with any questions or concerns Happy Early Birthday!!! 

## 2013-10-20 LAB — LIPID PANEL
Cholesterol: 156 mg/dL (ref 0–200)
HDL: 41.7 mg/dL (ref >39.00)
LDL Cholesterol: 99 mg/dL (ref 0–99)
NONHDL: 114.3
Total CHOL/HDL Ratio: 4
Triglycerides: 77 mg/dL (ref 0.0–149.0)
VLDL: 15.4 mg/dL (ref 0.0–40.0)

## 2013-10-20 LAB — HEPATIC FUNCTION PANEL
ALBUMIN: 4.3 g/dL (ref 3.5–5.2)
ALT: 18 U/L (ref 0–53)
AST: 24 U/L (ref 0–37)
Alkaline Phosphatase: 63 U/L (ref 39–117)
BILIRUBIN DIRECT: 0 mg/dL (ref 0.0–0.3)
BILIRUBIN TOTAL: 0.4 mg/dL (ref 0.2–1.2)
Total Protein: 7.3 g/dL (ref 6.0–8.3)

## 2013-10-20 LAB — BASIC METABOLIC PANEL
BUN: 14 mg/dL (ref 6–23)
CHLORIDE: 107 meq/L (ref 96–112)
CO2: 27 meq/L (ref 19–32)
Calcium: 9.3 mg/dL (ref 8.4–10.5)
Creatinine, Ser: 1.2 mg/dL (ref 0.4–1.5)
GFR: 91.02 mL/min (ref >60.00)
Glucose, Bld: 82 mg/dL (ref 70–99)
POTASSIUM: 4.2 meq/L (ref 3.5–5.1)
SODIUM: 138 meq/L (ref 135–145)

## 2013-10-20 LAB — CBC WITH DIFFERENTIAL/PLATELET
BASOS ABS: 0 10*3/uL (ref 0.0–0.1)
Basophils Relative: 0.6 % (ref 0.0–3.0)
Eosinophils Absolute: 0.1 10*3/uL (ref 0.0–0.7)
Eosinophils Relative: 1.4 % (ref 0.0–5.0)
HCT: 47.3 % (ref 39.0–52.0)
HEMOGLOBIN: 15.7 g/dL (ref 13.0–17.0)
Lymphocytes Relative: 28.7 % (ref 12.0–46.0)
Lymphs Abs: 1.4 10*3/uL (ref 0.7–4.0)
MCHC: 33.3 g/dL (ref 30.0–36.0)
MCV: 86.9 fl (ref 78.0–100.0)
MONO ABS: 0.4 10*3/uL (ref 0.1–1.0)
MONOS PCT: 9.1 % (ref 3.0–12.0)
Neutro Abs: 2.9 10*3/uL (ref 1.4–7.7)
Neutrophils Relative %: 60.2 % (ref 43.0–77.0)
PLATELETS: 206 10*3/uL (ref 150.0–400.0)
RBC: 5.45 Mil/uL (ref 4.22–5.81)
RDW: 13 % (ref 11.5–15.5)
WBC: 4.8 10*3/uL (ref 4.0–10.5)

## 2013-10-20 LAB — TSH: TSH: 1.34 u[IU]/mL (ref 0.35–4.50)

## 2013-10-20 LAB — GC/CHLAMYDIA PROBE AMP, URINE
Chlamydia, Swab/Urine, PCR: NEGATIVE
GC Probe Amp, Urine: NEGATIVE

## 2013-10-20 LAB — RPR

## 2013-10-20 LAB — HIV ANTIBODY (ROUTINE TESTING W REFLEX): HIV 1&2 Ab, 4th Generation: NONREACTIVE

## 2013-11-30 ENCOUNTER — Encounter: Payer: 59 | Admitting: Family Medicine

## 2014-05-06 ENCOUNTER — Telehealth: Payer: Self-pay | Admitting: Family Medicine

## 2014-05-06 ENCOUNTER — Encounter: Payer: Self-pay | Admitting: Family Medicine

## 2014-05-06 NOTE — Telephone Encounter (Signed)
Relation to pt: self  Call back number: [phone]   Reason for call:  Pt requesting a letter for work purposes stating he has lower back problems, pt states it does not need to indicate restriction. Pt states MD is aware and pt was last seen  10/19/13 for he's physical. Pt would like to pick up letter. Please advise

## 2014-05-06 NOTE — Telephone Encounter (Signed)
Called pt and LMOVM to inform that letter was placed at the front desk for pick up.

## 2014-05-06 NOTE — Telephone Encounter (Signed)
Letter written.  Please print for signature.

## 2014-05-12 NOTE — Telephone Encounter (Signed)
Patient states that his employer told him that he needs a new letter written specifically stating that he needs a "webbed belt". Best # [phone]

## 2014-05-12 NOTE — Telephone Encounter (Signed)
[phone] as requested by pt.

## 2014-10-22 ENCOUNTER — Telehealth: Payer: Self-pay | Admitting: Behavioral Health

## 2014-10-22 NOTE — Telephone Encounter (Signed)
Unable to reach patient at time of Pre-Visit Call.  Left message for patient to return call when available.    

## 2014-10-25 ENCOUNTER — Ambulatory Visit (INDEPENDENT_AMBULATORY_CARE_PROVIDER_SITE_OTHER): Payer: Federal, State, Local not specified - PPO | Admitting: Family Medicine

## 2014-10-25 ENCOUNTER — Encounter: Payer: Self-pay | Admitting: Family Medicine

## 2014-10-25 VITALS — BP 137/87 | HR 66 | Temp 97.1°F | Ht 70.0 in | Wt 236.6 lb

## 2014-10-25 DIAGNOSIS — Z202 Contact with and (suspected) exposure to infections with a predominantly sexual mode of transmission: Secondary | ICD-10-CM | POA: Diagnosis not present

## 2014-10-25 DIAGNOSIS — Z0000 Encounter for general adult medical examination without abnormal findings: Secondary | ICD-10-CM

## 2014-10-25 LAB — LIPID PANEL
CHOLESTEROL: 135 mg/dL (ref 0–200)
HDL: 46.2 mg/dL (ref >39.00)
LDL Cholesterol: 77 mg/dL (ref 0–99)
NONHDL: 88.83
Total CHOL/HDL Ratio: 3
Triglycerides: 57 mg/dL (ref 0.0–149.0)
VLDL: 11.4 mg/dL (ref 0.0–40.0)

## 2014-10-25 LAB — BASIC METABOLIC PANEL
BUN: 18 mg/dL (ref 6–23)
CALCIUM: 9.6 mg/dL (ref 8.4–10.5)
CHLORIDE: 109 meq/L (ref 96–112)
CO2: 26 mEq/L (ref 19–32)
CREATININE: 1.04 mg/dL (ref 0.40–1.50)
GFR: 108.64 mL/min (ref >60.00)
GLUCOSE: 90 mg/dL (ref 70–99)
Potassium: 4.5 mEq/L (ref 3.5–5.1)
SODIUM: 144 meq/L (ref 135–145)

## 2014-10-25 LAB — CBC WITH DIFFERENTIAL/PLATELET
BASOS ABS: 0 10*3/uL (ref 0.0–0.1)
BASOS PCT: 0.4 % (ref 0.0–3.0)
EOS ABS: 0.1 10*3/uL (ref 0.0–0.7)
Eosinophils Relative: 1.4 % (ref 0.0–5.0)
HCT: 46.8 % (ref 39.0–52.0)
Hemoglobin: 15.5 g/dL (ref 13.0–17.0)
LYMPHS ABS: 1.6 10*3/uL (ref 0.7–4.0)
Lymphocytes Relative: 30.1 % (ref 12.0–46.0)
MCHC: 33 g/dL (ref 30.0–36.0)
MCV: 86.8 fl (ref 78.0–100.0)
MONO ABS: 0.5 10*3/uL (ref 0.1–1.0)
Monocytes Relative: 10.1 % (ref 3.0–12.0)
NEUTROS ABS: 3.1 10*3/uL (ref 1.4–7.7)
NEUTROS PCT: 58 % (ref 43.0–77.0)
PLATELETS: 214 10*3/uL (ref 150.0–400.0)
RBC: 5.4 Mil/uL (ref 4.22–5.81)
RDW: 13.1 % (ref 11.5–15.5)
WBC: 5.4 10*3/uL (ref 4.0–10.5)

## 2014-10-25 LAB — TSH: TSH: 1.32 u[IU]/mL (ref 0.35–4.50)

## 2014-10-25 LAB — HEPATIC FUNCTION PANEL
ALT: 22 U/L (ref 0–53)
AST: 22 U/L (ref 0–37)
Albumin: 4.4 g/dL (ref 3.5–5.2)
Alkaline Phosphatase: 64 U/L (ref 39–117)
BILIRUBIN DIRECT: 0.1 mg/dL (ref 0.0–0.3)
BILIRUBIN TOTAL: 0.5 mg/dL (ref 0.2–1.2)
TOTAL PROTEIN: 7.3 g/dL (ref 6.0–8.3)

## 2014-10-25 NOTE — Assessment & Plan Note (Signed)
Pt's PE WNL w/ exception of being overweight.  Stressed need for healthy diet and regular exercise.  Check labs.  Pt declines flu shot.  Anticipatory guidance provided.

## 2014-10-25 NOTE — Patient Instructions (Signed)
Follow up in 1 year or as needed We'll notify you of your lab results and make any changes if needed Continue to work on healthy diet and regular exercise Call with any questions or concerns If you want to join [place] at the new [place] office, any scheduled appointments will automatically transfer and we will see you at [address] [place] [address] [place], [place] [address]  Happy Early Birthday!!!

## 2014-10-25 NOTE — Progress Notes (Signed)
Pre visit review using our clinic review tool, if applicable. No additional management support is needed unless otherwise documented below in the visit note. 

## 2014-10-25 NOTE — Progress Notes (Signed)
   Subjective:    Patient ID: [name], male    DOB: October 28, 1985, 29 y.o.   MRN: [id number]  HPI CPE- no concerns today.  Declines flu shot   Review of Systems Patient reports no vision/hearing changes, anorexia, fever ,adenopathy, persistant/recurrent hoarseness, swallowing issues, chest pain, palpitations, edema, persistant/recurrent cough, hemoptysis, dyspnea (rest,exertional, paroxysmal nocturnal), gastrointestinal  bleeding (melena, rectal bleeding), abdominal pain, excessive heart burn, GU symptoms (dysuria, hematuria, voiding/incontinence issues) syncope, focal weakness, memory loss, numbness & tingling, skin/hair/nail changes, depression, anxiety, abnormal bruising/bleeding, musculoskeletal symptoms/signs.     Objective:   Physical Exam General Appearance:    Alert, cooperative, no distress, appears stated age  Head:    Normocephalic, without obvious abnormality, atraumatic  Eyes:    PERRL, conjunctiva/corneas clear, EOM's intact, fundi    benign, both eyes       Ears:    Normal TM's and external ear canals, both ears  Nose:   Nares normal, septum midline, mucosa normal, no drainage   or sinus tenderness  Throat:   Lips, mucosa, and tongue normal; teeth and gums normal  Neck:   Supple, symmetrical, trachea midline, no adenopathy;       thyroid:  No enlargement/tenderness/nodules  Back:     Symmetric, no curvature, ROM normal, no CVA tenderness  Lungs:     Clear to auscultation bilaterally, respirations unlabored  Chest wall:    No tenderness or deformity  Heart:    Regular rate and rhythm, S1 and S2 normal, no murmur, rub   or gallop  Abdomen:     Soft, non-tender, bowel sounds active all four quadrants,    no masses, no organomegaly  Genitalia:    Normal male without lesion, masses,discharge or tenderness  Rectal:    Deferred due to young age  Extremities:   Extremities normal, atraumatic, no cyanosis or edema  Pulses:   2+ and symmetric all extremities  Skin:   Skin  color, texture, turgor normal, no rashes or lesions  Lymph nodes:   Cervical, supraclavicular, and axillary nodes normal  Neurologic:   CNII-XII intact. Normal strength, sensation and reflexes      throughout          Assessment & Plan:

## 2014-10-26 LAB — GC/CHLAMYDIA PROBE AMP, URINE
CHLAMYDIA, SWAB/URINE, PCR: NEGATIVE
GC PROBE AMP, URINE: NEGATIVE

## 2014-10-26 LAB — RPR

## 2014-10-27 LAB — HIV ANTIBODY (ROUTINE TESTING W REFLEX): HIV: NONREACTIVE

## 2015-10-18 DIAGNOSIS — M791 Myalgia: Secondary | ICD-10-CM | POA: Diagnosis not present

## 2015-10-18 DIAGNOSIS — M9903 Segmental and somatic dysfunction of lumbar region: Secondary | ICD-10-CM | POA: Diagnosis not present

## 2015-10-18 DIAGNOSIS — M545 Low back pain: Secondary | ICD-10-CM | POA: Diagnosis not present

## 2015-10-18 DIAGNOSIS — M2560 Stiffness of unspecified joint, not elsewhere classified: Secondary | ICD-10-CM | POA: Diagnosis not present

## 2015-10-19 DIAGNOSIS — M9903 Segmental and somatic dysfunction of lumbar region: Secondary | ICD-10-CM | POA: Diagnosis not present

## 2015-10-19 DIAGNOSIS — M2560 Stiffness of unspecified joint, not elsewhere classified: Secondary | ICD-10-CM | POA: Diagnosis not present

## 2015-10-19 DIAGNOSIS — M791 Myalgia: Secondary | ICD-10-CM | POA: Diagnosis not present

## 2015-10-19 DIAGNOSIS — M545 Low back pain: Secondary | ICD-10-CM | POA: Diagnosis not present

## 2015-10-24 DIAGNOSIS — M9903 Segmental and somatic dysfunction of lumbar region: Secondary | ICD-10-CM | POA: Diagnosis not present

## 2015-10-24 DIAGNOSIS — M791 Myalgia: Secondary | ICD-10-CM | POA: Diagnosis not present

## 2015-10-24 DIAGNOSIS — M2560 Stiffness of unspecified joint, not elsewhere classified: Secondary | ICD-10-CM | POA: Diagnosis not present

## 2015-10-24 DIAGNOSIS — M545 Low back pain: Secondary | ICD-10-CM | POA: Diagnosis not present

## 2015-10-26 ENCOUNTER — Other Ambulatory Visit (HOSPITAL_COMMUNITY)
Admission: RE | Admit: 2015-10-26 | Discharge: 2015-10-26 | Disposition: A | Discharge status: Home / Self Care | Payer: Federal, State, Local not specified - PPO | Source: Ambulatory Visit | Attending: Family Medicine | Admitting: Family Medicine

## 2015-10-26 ENCOUNTER — Ambulatory Visit (INDEPENDENT_AMBULATORY_CARE_PROVIDER_SITE_OTHER): Payer: Federal, State, Local not specified - PPO | Admitting: Family Medicine

## 2015-10-26 ENCOUNTER — Encounter: Payer: Self-pay | Admitting: Family Medicine

## 2015-10-26 VITALS — BP 120/82 | HR 52 | Temp 98.0°F | Resp 16 | Ht 70.0 in | Wt 207.5 lb

## 2015-10-26 DIAGNOSIS — Z0000 Encounter for general adult medical examination without abnormal findings: Secondary | ICD-10-CM

## 2015-10-26 DIAGNOSIS — Z202 Contact with and (suspected) exposure to infections with a predominantly sexual mode of transmission: Secondary | ICD-10-CM

## 2015-10-26 DIAGNOSIS — Z113 Encounter for screening for infections with a predominantly sexual mode of transmission: Secondary | ICD-10-CM | POA: Diagnosis not present

## 2015-10-26 LAB — TSH: TSH: 0.96 u[IU]/mL (ref 0.35–4.50)

## 2015-10-26 LAB — BASIC METABOLIC PANEL
BUN: 16 mg/dL (ref 6–23)
CO2: 28 mEq/L (ref 19–32)
CREATININE: 1.05 mg/dL (ref 0.40–1.50)
Calcium: 9.5 mg/dL (ref 8.4–10.5)
Chloride: 105 mEq/L (ref 96–112)
GFR: 106.7 mL/min (ref >60.00)
Glucose, Bld: 78 mg/dL (ref 70–99)
POTASSIUM: 4 meq/L (ref 3.5–5.1)
Sodium: 143 mEq/L (ref 135–145)

## 2015-10-26 LAB — CBC WITH DIFFERENTIAL/PLATELET
Basophils Absolute: 0 10*3/uL (ref 0.0–0.1)
Basophils Relative: 0.5 % (ref 0.0–3.0)
EOS PCT: 1.5 % (ref 0.0–5.0)
Eosinophils Absolute: 0.1 10*3/uL (ref 0.0–0.7)
HCT: 46.4 % (ref 39.0–52.0)
Hemoglobin: 15.7 g/dL (ref 13.0–17.0)
LYMPHS PCT: 38.2 % (ref 12.0–46.0)
Lymphs Abs: 1.7 10*3/uL (ref 0.7–4.0)
MCHC: 33.7 g/dL (ref 30.0–36.0)
MCV: 86.6 fl (ref 78.0–100.0)
MONOS PCT: 12.9 % — AB (ref 3.0–12.0)
Monocytes Absolute: 0.6 10*3/uL (ref 0.1–1.0)
NEUTROS PCT: 46.9 % (ref 43.0–77.0)
Neutro Abs: 2 10*3/uL (ref 1.4–7.7)
PLATELETS: 194 10*3/uL (ref 150.0–400.0)
RBC: 5.36 Mil/uL (ref 4.22–5.81)
RDW: 13.2 % (ref 11.5–15.5)
WBC: 4.4 10*3/uL (ref 4.0–10.5)

## 2015-10-26 LAB — HEPATIC FUNCTION PANEL
ALBUMIN: 4.3 g/dL (ref 3.5–5.2)
ALT: 13 U/L (ref 0–53)
AST: 17 U/L (ref 0–37)
Alkaline Phosphatase: 58 U/L (ref 39–117)
Bilirubin, Direct: 0.2 mg/dL (ref 0.0–0.3)
Total Bilirubin: 0.7 mg/dL (ref 0.2–1.2)
Total Protein: 7.2 g/dL (ref 6.0–8.3)

## 2015-10-26 LAB — LIPID PANEL
CHOL/HDL RATIO: 3
Cholesterol: 128 mg/dL (ref 0–200)
HDL: 42.8 mg/dL (ref >39.00)
LDL CALC: 74 mg/dL (ref 0–99)
NonHDL: 85.66
TRIGLYCERIDES: 58 mg/dL (ref 0.0–149.0)
VLDL: 11.6 mg/dL (ref 0.0–40.0)

## 2015-10-26 NOTE — Progress Notes (Signed)
   Subjective:    Patient ID: [name], male    DOB: 1985-08-03, 30 y.o.   MRN: [id number]  HPI CPE- declines flu shot.  No concerns today.     Review of Systems Patient reports no vision/hearing changes, anorexia, fever ,adenopathy, persistant/recurrent hoarseness, swallowing issues, chest pain, palpitations, edema, persistant/recurrent cough, hemoptysis, dyspnea (rest,exertional, paroxysmal nocturnal), gastrointestinal  bleeding (melena, rectal bleeding), abdominal pain, excessive heart burn, GU symptoms (dysuria, hematuria, voiding/incontinence issues) syncope, focal weakness, memory loss, numbness & tingling, skin/hair/nail changes, depression, anxiety, abnormal bruising/bleeding, musculoskeletal symptoms/signs.     Objective:   Physical Exam General Appearance:    Alert, cooperative, no distress, appears stated age  Head:    Normocephalic, without obvious abnormality, atraumatic  Eyes:    PERRL, conjunctiva/corneas clear, EOM's intact, fundi    benign, both eyes       Ears:    Normal TM's and external ear canals, both ears  Nose:   Nares normal, septum midline, mucosa normal, no drainage   or sinus tenderness  Throat:   Lips, mucosa, and tongue normal; teeth and gums normal  Neck:   Supple, symmetrical, trachea midline, no adenopathy;       thyroid:  No enlargement/tenderness/nodules  Back:     Symmetric, no curvature, ROM normal, no CVA tenderness  Lungs:     Clear to auscultation bilaterally, respirations unlabored  Chest wall:    No tenderness or deformity  Heart:    Regular rate and rhythm, S1 and S2 normal, no murmur, rub   or gallop  Abdomen:     Soft, non-tender, bowel sounds active all four quadrants,    no masses, no organomegaly  Genitalia:    Normal male without lesion, masses,discharge or tenderness  Rectal:    Deferred due to young age  Extremities:   Extremities normal, atraumatic, no cyanosis or edema  Pulses:   2+ and symmetric all extremities  Skin:   Skin  color, texture, turgor normal, no rashes or lesions  Lymph nodes:   Cervical, supraclavicular, and axillary nodes normal  Neurologic:   CNII-XII intact. Normal strength, sensation and reflexes      throughout          Assessment & Plan:

## 2015-10-26 NOTE — Progress Notes (Signed)
Pre visit review using our clinic review tool, if applicable. No additional management support is needed unless otherwise documented below in the visit note. 

## 2015-10-26 NOTE — Patient Instructions (Signed)
Follow up in 1 year or as needed We'll notify you of your lab results and make any changes if needed Keep up the good work on healthy diet and regular exercise- you look great!!! Call with any questions or concerns Happy Early Birthday!!! GOOD LUCK WITH THE NEW JOB!!!

## 2015-10-26 NOTE — Assessment & Plan Note (Signed)
Pt's PE WNL.  UTD on Tdap.  Declines flu.  Check labs.  Anticipatory guidance provided.  

## 2015-10-27 LAB — HIV ANTIBODY (ROUTINE TESTING W REFLEX): HIV: NONREACTIVE

## 2015-10-27 LAB — RPR

## 2015-10-28 LAB — URINE CYTOLOGY ANCILLARY ONLY
Chlamydia: NEGATIVE
Neisseria Gonorrhea: NEGATIVE

## 2015-11-01 DIAGNOSIS — M9903 Segmental and somatic dysfunction of lumbar region: Secondary | ICD-10-CM | POA: Diagnosis not present

## 2015-11-01 DIAGNOSIS — M545 Low back pain: Secondary | ICD-10-CM | POA: Diagnosis not present

## 2015-11-01 DIAGNOSIS — M791 Myalgia: Secondary | ICD-10-CM | POA: Diagnosis not present

## 2015-11-01 DIAGNOSIS — M2560 Stiffness of unspecified joint, not elsewhere classified: Secondary | ICD-10-CM | POA: Diagnosis not present

## 2015-12-28 ENCOUNTER — Telehealth: Payer: Self-pay | Admitting: *Deleted

## 2015-12-28 NOTE — Telephone Encounter (Signed)
Patient called and said that he needed his Cardiology records and possible visit with Cardiology from when Dr. Beverely Lowabori sent him for his Heart Murmur in 2010.  He could not remember the name of the doctor that he saw.  I researched his chart and found that he had seen Dr. Clifton JamesMcAlhany in 2010.   [facility] and he is going to call to get his records.  I let him know if he needed anything further from us to call and we would see what we could do.   Patient was appreciative of the assistance.

## 2016-02-17 ENCOUNTER — Telehealth: Payer: Self-pay | Admitting: Family Medicine

## 2016-02-17 DIAGNOSIS — R011 Cardiac murmur, unspecified: Secondary | ICD-10-CM

## 2016-02-17 NOTE — Telephone Encounter (Signed)
Patient states he is applying for a new job and they are asking him to see a cardiologist for clearance since he has a hx of heart murmur.  Patient states he was previously referred to a cardiologist for an evaluation  ago but does not remember exactly when or with who.  He wants to know if pcp has any records of who he saw and if the records state his condition was stable.  If not, he will need a new referral to see cardiologist for evaluation.

## 2016-02-17 NOTE — Telephone Encounter (Signed)
Pt seen Dr. Sanjuana KavaMcalhaney in 2010 per his chart (notes under other procedures). They completed an ECHO. He would probably need another referral back to them to be cleared again? Or is that report ok?

## 2016-02-17 NOTE — Telephone Encounter (Signed)
Ok to refer back to Dr Clifton JamesMcAlhany since they are asking for repeat ECHO for heart murmur (he has hx of congenital heart issue I believe)

## 2016-02-17 NOTE — Telephone Encounter (Signed)
Spoke with pt, printed echo report from 2010 and mailed to pt as requested. Referral to cards also placed.

## 2016-03-08 NOTE — Progress Notes (Signed)
Chief Complaint  Patient presents with  . New Patient (Initial Visit)  Cardiac murmur  History of Present Illness: 31 yo AAM with history of mild valvular heart disease who is here today to re-establish cardiac care. I last saw him in 2010. He had atypical chest pain at that time. Echo April 2010 with normal LV systolic function, trivial MR, trivial TR. Of note, he reports a cardiac cath when he was 31 years old in [place]. He is not sure why he had this but he knows it was normal.   He tells me today that he feels great. No chest pain or dyspnea. No LE edema, dizziness, palpitations. He is applying for a job with the [employer] and will need clearance before he can start.   Primary Care Physician: Neena RhymesKatherine Tabori, MD  Past Medical History:  Diagnosis Date  . Heart murmur   . Mitral regurgitation    Trivial by echo in 2010    Past Surgical History:  Procedure Laterality Date  . SHOULDER SURGERY     He takes no medications Current Outpatient Prescriptions  Medication Sig Dispense Refill  . ibuprofen (ADVIL,MOTRIN) 200 MG tablet Take 200 mg by mouth 2 (two) times daily as needed for moderate pain or cramping.     No current facility-administered medications for this visit.     No Known Allergies  Social History   Social History  . Marital status: Single    Spouse name: N/A  . Number of children: N/A  . Years of education: N/A   Occupational History  . Works in [employer]    Social History Main Topics  . Smoking status: Never Smoker  . Smokeless tobacco: Never Used  . Alcohol use No  . Drug use: No  . Sexual activity: Not on file   Other Topics Concern  . Not on file   Social History Narrative  . No narrative on file    Family History  Problem Relation Age of Onset  . Hypertension Mother   . Hypertension Father   . Diabetes      grandmother    Review of Systems:  As stated in the HPI and otherwise negative.   BP 122/82   Pulse (!) 44   Ht 5\' 10"   (1.778 m)   Wt 207 lb 6.4 oz (94.1 kg)   BMI 29.76 kg/m   Physical Examination: General: Well developed, well nourished, NAD  HEENT: OP clear, mucus membranes moist  SKIN: warm, dry. No rashes. Neuro: No focal deficits  Musculoskeletal: Muscle strength 5/5 all ext  Psychiatric: Mood and affect normal  Neck: No JVD, no carotid bruits, no thyromegaly, no lymphadenopathy.  Lungs:Clear bilaterally, no wheezes, rhonci, crackles Cardiovascular: Regular rate and rhythm. No murmurs, gallops or rubs. Abdomen:Soft. Bowel sounds present. Non-tender.  Extremities: No lower extremity edema. Pulses are 2 + in the bilateral DP/PT.  EKG:  EKG is ordered today. The ekg ordered today demonstrates Sinus bradycardia, rate 44 bpm. Incomplete RBBB  Recent Labs: 10/26/2015: ALT 13; BUN 16; Creatinine, Ser 1.05; Hemoglobin 15.7; Platelets 194.0; Potassium 4.0; Sodium 143; TSH 0.96   Lipid Panel    Component Value Date/Time   CHOL 128 10/26/2015 0839   TRIG 58.0 10/26/2015 0839   HDL 42.80 10/26/2015 0839   CHOLHDL 3 10/26/2015 0839   VLDL 11.6 10/26/2015 0839   LDLCALC 74 10/26/2015 0839     Wt Readings from Last 3 Encounters:  03/09/16 207 lb 6.4 oz (94.1 kg)  10/26/15 207 lb 8 oz (94.1 kg)  10/25/14 236 lb 9.6 oz (107.3 kg)     Other studies Reviewed: Additional studies/ records that were reviewed today include: . Review of the above records demonstrates:    Assessment and Plan:   1. Cardiac murmur: He had a prior echo in 2010 with trivial MR and trivial TR. He has no symptoms. He is very active and is applying for a job with the [employer]. Will arrange echo now to exclude progression of   Current medicines are reviewed at length with the patient today.  The patient does not have concerns regarding medicines.  The following changes have been made:  no change  Labs/ tests ordered today include:   Orders Placed This Encounter  Procedures  . EKG 12-Lead  . ECHOCARDIOGRAM COMPLETE    Disposition:   FU with me as needed.   Signed, Verne Carrow, MD 03/09/2016 10:36 AM    [facility] [address] [place], [place], [place]  [address] Phone: [phone]; Fax: [phone]

## 2016-03-09 ENCOUNTER — Ambulatory Visit (HOSPITAL_COMMUNITY): Payer: Federal, State, Local not specified - PPO | Attending: Cardiology

## 2016-03-09 ENCOUNTER — Ambulatory Visit (INDEPENDENT_AMBULATORY_CARE_PROVIDER_SITE_OTHER): Payer: Federal, State, Local not specified - PPO | Admitting: Cardiovascular Disease

## 2016-03-09 ENCOUNTER — Other Ambulatory Visit: Payer: Self-pay

## 2016-03-09 ENCOUNTER — Encounter: Payer: Self-pay | Admitting: Cardiovascular Disease

## 2016-03-09 VITALS — BP 122/82 | HR 44 | Ht 70.0 in | Wt 207.4 lb

## 2016-03-09 DIAGNOSIS — I340 Nonrheumatic mitral (valve) insufficiency: Secondary | ICD-10-CM

## 2016-03-09 DIAGNOSIS — I517 Cardiomegaly: Secondary | ICD-10-CM | POA: Insufficient documentation

## 2016-03-09 DIAGNOSIS — R011 Cardiac murmur, unspecified: Secondary | ICD-10-CM | POA: Diagnosis not present

## 2016-03-09 DIAGNOSIS — I361 Nonrheumatic tricuspid (valve) insufficiency: Secondary | ICD-10-CM | POA: Diagnosis not present

## 2016-03-09 NOTE — Patient Instructions (Signed)
Medication Instructions:  Your physician recommends that you continue on your current medications as directed. Please refer to the Current Medication list given to you today.   Labwork: none  Testing/Procedures: Your physician has requested that you have an echocardiogram. Echocardiography is a painless test that uses sound waves to create images of your heart. It provides your doctor with information about the size and shape of your heart and how well your heart's chambers and valves are working. This procedure takes approximately one hour. There are no restrictions for this procedure. To be done today  Follow-Up: Your physician recommends that you schedule a follow-up appointment as needed.    Any Other Special Instructions Will Be Listed Below (If Applicable).     If you need a refill on your cardiac medications before your next appointment, please call your pharmacy.

## 2016-03-10 LAB — ECHOCARDIOGRAM COMPLETE
Height: 70 in
Weight: 3318.4 oz

## 2016-03-12 ENCOUNTER — Telehealth: Payer: Self-pay | Admitting: Cardiovascular Disease

## 2016-03-12 NOTE — Telephone Encounter (Signed)
PT AWARE  TEST  HAS  NOT BEEN REVIEWED  AS OF YET  BUT  WILL CALL  WITH  FINDINGS ASAP./PT AGREESCY

## 2016-03-12 NOTE — Telephone Encounter (Signed)
Mr. [name] is calling to get his results from his Echo . Please call . Thanks

## 2016-03-13 ENCOUNTER — Encounter: Payer: Self-pay | Admitting: Cardiovascular Disease

## 2016-03-13 NOTE — Telephone Encounter (Signed)
-----   Message from Kathleene Hazelhristopher D McAlhany, MD sent at 03/13/2016  8:24 AM EST ----- Can we let Mr. [name] know that his echo is overall normal with very mild leakiness of the tricuspid valve, unchanged since last echo in 2010. He is applying for a job with the [employer] and he needed a clearance letter. I have written a letter (in Letter section of [employer]). Can we ask if he needs us to fax this somewhere or if he wants to come by and pick it up?   Thanks,  Thayer Ohmhris

## 2016-03-13 NOTE — Telephone Encounter (Signed)
Patient made aware of results. Patient verbalizes understanding. Clearance letter mailed to patient and the agency per patient's request.

## 2016-10-26 ENCOUNTER — Encounter: Payer: Federal, State, Local not specified - PPO | Admitting: Family Medicine

## 2016-11-12 ENCOUNTER — Other Ambulatory Visit (HOSPITAL_COMMUNITY)
Admission: RE | Admit: 2016-11-12 | Discharge: 2016-11-12 | Disposition: A | Discharge status: Home / Self Care | Payer: Federal, State, Local not specified - PPO | Source: Ambulatory Visit | Attending: Family Medicine | Admitting: Family Medicine

## 2016-11-12 ENCOUNTER — Ambulatory Visit (INDEPENDENT_AMBULATORY_CARE_PROVIDER_SITE_OTHER): Payer: Federal, State, Local not specified - PPO | Admitting: Family Medicine

## 2016-11-12 ENCOUNTER — Encounter: Payer: Self-pay | Admitting: Family Medicine

## 2016-11-12 VITALS — BP 123/81 | HR 53 | Temp 98.0°F | Resp 16 | Ht 70.0 in | Wt 204.1 lb

## 2016-11-12 DIAGNOSIS — E663 Overweight: Secondary | ICD-10-CM

## 2016-11-12 DIAGNOSIS — Z202 Contact with and (suspected) exposure to infections with a predominantly sexual mode of transmission: Secondary | ICD-10-CM

## 2016-11-12 DIAGNOSIS — Z0000 Encounter for general adult medical examination without abnormal findings: Secondary | ICD-10-CM

## 2016-11-12 LAB — LIPID PANEL
Cholesterol: 157 mg/dL (ref 0–200)
HDL: 56.3 mg/dL (ref >39.00)
LDL Cholesterol: 86 mg/dL (ref 0–99)
NONHDL: 100.6
TRIGLYCERIDES: 74 mg/dL (ref 0.0–149.0)
Total CHOL/HDL Ratio: 3
VLDL: 14.8 mg/dL (ref 0.0–40.0)

## 2016-11-12 LAB — CBC WITH DIFFERENTIAL/PLATELET
BASOS PCT: 0.7 % (ref 0.0–3.0)
Basophils Absolute: 0 10*3/uL (ref 0.0–0.1)
EOS ABS: 0 10*3/uL (ref 0.0–0.7)
EOS PCT: 0.9 % (ref 0.0–5.0)
HCT: 48.4 % (ref 39.0–52.0)
Hemoglobin: 16 g/dL (ref 13.0–17.0)
LYMPHS ABS: 1.3 10*3/uL (ref 0.7–4.0)
Lymphocytes Relative: 29 % (ref 12.0–46.0)
MCHC: 33.1 g/dL (ref 30.0–36.0)
MCV: 88.5 fl (ref 78.0–100.0)
MONO ABS: 0.5 10*3/uL (ref 0.1–1.0)
Monocytes Relative: 10.3 % (ref 3.0–12.0)
NEUTROS ABS: 2.6 10*3/uL (ref 1.4–7.7)
NEUTROS PCT: 59.1 % (ref 43.0–77.0)
PLATELETS: 188 10*3/uL (ref 150.0–400.0)
RBC: 5.47 Mil/uL (ref 4.22–5.81)
RDW: 13.2 % (ref 11.5–15.5)
WBC: 4.4 10*3/uL (ref 4.0–10.5)

## 2016-11-12 LAB — BASIC METABOLIC PANEL
BUN: 14 mg/dL (ref 6–23)
CHLORIDE: 104 meq/L (ref 96–112)
CO2: 30 meq/L (ref 19–32)
Calcium: 9.9 mg/dL (ref 8.4–10.5)
Creatinine, Ser: 1.03 mg/dL (ref 0.40–1.50)
GFR: 108.34 mL/min (ref >60.00)
Glucose, Bld: 90 mg/dL (ref 70–99)
POTASSIUM: 4.3 meq/L (ref 3.5–5.1)
Sodium: 141 mEq/L (ref 135–145)

## 2016-11-12 LAB — HEPATIC FUNCTION PANEL
ALK PHOS: 55 U/L (ref 39–117)
ALT: 15 U/L (ref 0–53)
AST: 18 U/L (ref 0–37)
Albumin: 4.5 g/dL (ref 3.5–5.2)
BILIRUBIN TOTAL: 0.8 mg/dL (ref 0.2–1.2)
Bilirubin, Direct: 0.2 mg/dL (ref 0.0–0.3)
Total Protein: 7.2 g/dL (ref 6.0–8.3)

## 2016-11-12 LAB — TSH: TSH: 0.9 u[IU]/mL (ref 0.35–4.50)

## 2016-11-12 NOTE — Patient Instructions (Addendum)
Follow up in 1 year or as needed We'll notify you of your lab results and make any changes if needed Continue to work on healthy diet and regular exercise- you look great! Call with any questions or concerns Happy Early Birthday!!! 

## 2016-11-12 NOTE — Assessment & Plan Note (Signed)
Pt's PE WNL w/ exception of being overweight but this is misleading as it is due to his muscle mass.  UTD on Tdap.  Declines flu shot.  Check labs.  Anticipatory guidance provided.

## 2016-11-12 NOTE — Progress Notes (Signed)
   Subjective:    Patient ID: [name], male    DOB: 1985-07-14, 31 y.o.   MRN: [id number]  HPI CPE- UTD on Tdap, declines flu.  No concerns today.   Review of Systems Patient reports no vision/hearing changes, anorexia, fever ,adenopathy, persistant/recurrent hoarseness, swallowing issues, chest pain, palpitations, edema, persistant/recurrent cough, hemoptysis, dyspnea (rest,exertional, paroxysmal nocturnal), gastrointestinal  bleeding (melena, rectal bleeding), abdominal pain, excessive heart burn, GU symptoms (dysuria, hematuria, voiding/incontinence issues) syncope, focal weakness, memory loss, numbness & tingling, skin/hair/nail changes, depression, anxiety, abnormal bruising/bleeding, musculoskeletal symptoms/signs.     Objective:   Physical Exam General Appearance:    Alert, cooperative, no distress, appears stated age  Head:    Normocephalic, without obvious abnormality, atraumatic  Eyes:    PERRL, conjunctiva/corneas clear, EOM's intact, fundi    benign, both eyes       Ears:    Normal TM's and external ear canals, both ears  Nose:   Nares normal, septum midline, mucosa normal, no drainage   or sinus tenderness  Throat:   Lips, mucosa, and tongue normal; teeth and gums normal  Neck:   Supple, symmetrical, trachea midline, no adenopathy;       thyroid:  No enlargement/tenderness/nodules  Back:     Symmetric, no curvature, ROM normal, no CVA tenderness  Lungs:     Clear to auscultation bilaterally, respirations unlabored  Chest wall:    No tenderness or deformity  Heart:    Regular rate and rhythm, S1 and S2 normal, very faint I/VI SEM, no rub or gallop  Abdomen:     Soft, non-tender, bowel sounds active all four quadrants,    no masses, no organomegaly  Genitalia:    Normal male without lesion, masses,discharge or tenderness  Rectal:    Deferred due to young age  Extremities:   Extremities normal, atraumatic, no cyanosis or edema  Pulses:   2+ and symmetric all extremities   Skin:   Skin color, texture, turgor normal, no rashes or lesions  Lymph nodes:   Cervical, supraclavicular, and axillary nodes normal  Neurologic:   CNII-XII intact. Normal strength, sensation and reflexes      throughout          Assessment & Plan:

## 2016-11-13 ENCOUNTER — Encounter: Payer: Self-pay | Admitting: General Practice

## 2016-11-13 LAB — URINE CYTOLOGY ANCILLARY ONLY
CHLAMYDIA, DNA PROBE: NEGATIVE
Neisseria Gonorrhea: NEGATIVE

## 2016-11-13 LAB — RPR: RPR: NONREACTIVE

## 2016-11-13 LAB — HIV ANTIBODY (ROUTINE TESTING W REFLEX): HIV 1&2 Ab, 4th Generation: NONREACTIVE

## 2016-12-11 DIAGNOSIS — K080 Exfoliation of teeth due to systemic causes: Secondary | ICD-10-CM | POA: Diagnosis not present

## 2017-01-02 DIAGNOSIS — K080 Exfoliation of teeth due to systemic causes: Secondary | ICD-10-CM | POA: Diagnosis not present

## 2017-04-14 DIAGNOSIS — J019 Acute sinusitis, unspecified: Secondary | ICD-10-CM | POA: Diagnosis not present

## 2017-06-12 DIAGNOSIS — K080 Exfoliation of teeth due to systemic causes: Secondary | ICD-10-CM | POA: Diagnosis not present

## 2017-11-13 ENCOUNTER — Encounter: Payer: Federal, State, Local not specified - PPO | Admitting: Family Medicine

## 2017-11-15 ENCOUNTER — Other Ambulatory Visit: Payer: Self-pay

## 2017-11-15 ENCOUNTER — Ambulatory Visit (INDEPENDENT_AMBULATORY_CARE_PROVIDER_SITE_OTHER): Payer: Federal, State, Local not specified - PPO | Admitting: Family Medicine

## 2017-11-15 ENCOUNTER — Other Ambulatory Visit (HOSPITAL_COMMUNITY)
Admission: RE | Admit: 2017-11-15 | Discharge: 2017-11-15 | Disposition: A | Discharge status: Home / Self Care | Payer: Federal, State, Local not specified - PPO | Source: Ambulatory Visit | Attending: Family Medicine | Admitting: Family Medicine

## 2017-11-15 ENCOUNTER — Encounter: Payer: Self-pay | Admitting: Family Medicine

## 2017-11-15 VITALS — BP 118/81 | HR 71 | Temp 98.1°F | Resp 17 | Ht 70.0 in | Wt 195.5 lb

## 2017-11-15 DIAGNOSIS — E663 Overweight: Secondary | ICD-10-CM

## 2017-11-15 DIAGNOSIS — Z6829 Body mass index (BMI) 29.0-29.9, adult: Secondary | ICD-10-CM | POA: Insufficient documentation

## 2017-11-15 DIAGNOSIS — Z0000 Encounter for general adult medical examination without abnormal findings: Secondary | ICD-10-CM | POA: Diagnosis not present

## 2017-11-15 DIAGNOSIS — Z202 Contact with and (suspected) exposure to infections with a predominantly sexual mode of transmission: Secondary | ICD-10-CM

## 2017-11-15 LAB — LIPID PANEL
Cholesterol: 154 mg/dL (ref 0–200)
HDL: 50.6 mg/dL (ref >39.00)
LDL Cholesterol: 85 mg/dL (ref 0–99)
NonHDL: 103.01
Total CHOL/HDL Ratio: 3
Triglycerides: 89 mg/dL (ref 0.0–149.0)
VLDL: 17.8 mg/dL (ref 0.0–40.0)

## 2017-11-15 LAB — BASIC METABOLIC PANEL
BUN: 22 mg/dL (ref 6–23)
CO2: 26 mEq/L (ref 19–32)
Calcium: 9.5 mg/dL (ref 8.4–10.5)
Chloride: 104 mEq/L (ref 96–112)
Creatinine, Ser: 1.03 mg/dL (ref 0.40–1.50)
GFR: 107.64 mL/min (ref >60.00)
Glucose, Bld: 85 mg/dL (ref 70–99)
POTASSIUM: 4.2 meq/L (ref 3.5–5.1)
Sodium: 138 mEq/L (ref 135–145)

## 2017-11-15 LAB — CBC WITH DIFFERENTIAL/PLATELET
Basophils Absolute: 0.1 10*3/uL (ref 0.0–0.1)
Basophils Relative: 1.1 % (ref 0.0–3.0)
Eosinophils Absolute: 0.1 10*3/uL (ref 0.0–0.7)
Eosinophils Relative: 1.6 % (ref 0.0–5.0)
HCT: 46.9 % (ref 39.0–52.0)
Hemoglobin: 15.9 g/dL (ref 13.0–17.0)
Lymphocytes Relative: 30 % (ref 12.0–46.0)
Lymphs Abs: 1.4 10*3/uL (ref 0.7–4.0)
MCHC: 34 g/dL (ref 30.0–36.0)
MCV: 87.5 fl (ref 78.0–100.0)
Monocytes Absolute: 0.5 10*3/uL (ref 0.1–1.0)
Monocytes Relative: 11.2 % (ref 3.0–12.0)
Neutro Abs: 2.6 10*3/uL (ref 1.4–7.7)
Neutrophils Relative %: 56.1 % (ref 43.0–77.0)
Platelets: 189 10*3/uL (ref 150.0–400.0)
RBC: 5.36 Mil/uL (ref 4.22–5.81)
RDW: 13.3 % (ref 11.5–15.5)
WBC: 4.6 10*3/uL (ref 4.0–10.5)

## 2017-11-15 LAB — TSH: TSH: 1.2 u[IU]/mL (ref 0.35–4.50)

## 2017-11-15 LAB — HEPATIC FUNCTION PANEL
ALT: 17 U/L (ref 0–53)
AST: 20 U/L (ref 0–37)
Albumin: 4.5 g/dL (ref 3.5–5.2)
Alkaline Phosphatase: 58 U/L (ref 39–117)
Bilirubin, Direct: 0.1 mg/dL (ref 0.0–0.3)
Total Bilirubin: 0.7 mg/dL (ref 0.2–1.2)
Total Protein: 7.1 g/dL (ref 6.0–8.3)

## 2017-11-15 NOTE — Assessment & Plan Note (Signed)
Pt's PE WNL.  UTD on Tdap.  Check labs.  Anticipatory guidance provided.  

## 2017-11-15 NOTE — Progress Notes (Signed)
   Subjective:    Patient ID: [name], male    DOB: May 20, 1985, 32 y.o.   MRN: [id number]  HPI CPE- UTD on Tdap.  Declines flu.  No concerns today.   Review of Systems Patient reports no vision/hearing changes, anorexia, fever ,adenopathy, persistant/recurrent hoarseness, swallowing issues, chest pain, palpitations, edema, persistant/recurrent cough, hemoptysis, dyspnea (rest,exertional, paroxysmal nocturnal), gastrointestinal  bleeding (melena, rectal bleeding), abdominal pain, excessive heart burn, GU symptoms (dysuria, hematuria, voiding/incontinence issues) syncope, focal weakness, memory loss, numbness & tingling, skin/hair/nail changes, depression, anxiety, abnormal bruising/bleeding, musculoskeletal symptoms/signs.     Objective:   Physical Exam General Appearance:    Alert, cooperative, no distress, appears stated age  Head:    Normocephalic, without obvious abnormality, atraumatic  Eyes:    PERRL, conjunctiva/corneas clear, EOM's intact, fundi    benign, both eyes       Ears:    Normal TM's and external ear canals, both ears  Nose:   Nares normal, septum midline, mucosa normal, no drainage   or sinus tenderness  Throat:   Lips, mucosa, and tongue normal; teeth and gums normal  Neck:   Supple, symmetrical, trachea midline, no adenopathy;       thyroid:  No enlargement/tenderness/nodules  Back:     Symmetric, no curvature, ROM normal, no CVA tenderness  Lungs:     Clear to auscultation bilaterally, respirations unlabored  Chest wall:    No tenderness or deformity  Heart:    Regular rate and rhythm, S1 and S2 normal, no murmur, rub   or gallop  Abdomen:     Soft, non-tender, bowel sounds active all four quadrants,    no masses, no organomegaly  Genitalia:    Normal male without lesion, masses,discharge or tenderness  Rectal:    Deferred due to young age  Extremities:   Extremities normal, atraumatic, no cyanosis or edema  Pulses:   2+ and symmetric all extremities  Skin:    Skin color, texture, turgor normal, no rashes or lesions  Lymph nodes:   Cervical, supraclavicular, and axillary nodes normal  Neurologic:   CNII-XII intact. Normal strength, sensation and reflexes      throughout          Assessment & Plan:

## 2017-11-15 NOTE — Patient Instructions (Signed)
Follow up in 1 year or as needed We'll notify you of your lab results and make any changes if needed Keep up the good work!  You look great! Call with any questions or concerns Happy Early Birthday!!! 

## 2017-11-18 ENCOUNTER — Encounter: Payer: Self-pay | Admitting: General Practice

## 2017-11-18 LAB — HIV ANTIBODY (ROUTINE TESTING W REFLEX): HIV 1&2 Ab, 4th Generation: NONREACTIVE

## 2017-11-18 LAB — RPR: RPR Ser Ql: NONREACTIVE

## 2017-11-19 LAB — URINE CYTOLOGY ANCILLARY ONLY
Chlamydia: NEGATIVE
Neisseria Gonorrhea: NEGATIVE

## 2017-12-13 DIAGNOSIS — K080 Exfoliation of teeth due to systemic causes: Secondary | ICD-10-CM | POA: Diagnosis not present

## 2018-01-03 DIAGNOSIS — K080 Exfoliation of teeth due to systemic causes: Secondary | ICD-10-CM | POA: Diagnosis not present

## 2018-02-17 DIAGNOSIS — J209 Acute bronchitis, unspecified: Secondary | ICD-10-CM | POA: Diagnosis not present

## 2018-02-17 DIAGNOSIS — J069 Acute upper respiratory infection, unspecified: Secondary | ICD-10-CM | POA: Diagnosis not present

## 2018-11-18 ENCOUNTER — Other Ambulatory Visit (HOSPITAL_COMMUNITY)
Admission: RE | Admit: 2018-11-18 | Discharge: 2018-11-18 | Disposition: A | Discharge status: Home / Self Care | Payer: Federal, State, Local not specified - PPO | Source: Ambulatory Visit | Attending: Family Medicine | Admitting: Family Medicine

## 2018-11-18 ENCOUNTER — Other Ambulatory Visit: Payer: Self-pay

## 2018-11-18 ENCOUNTER — Encounter: Payer: Self-pay | Admitting: Family Medicine

## 2018-11-18 ENCOUNTER — Ambulatory Visit (INDEPENDENT_AMBULATORY_CARE_PROVIDER_SITE_OTHER): Payer: Federal, State, Local not specified - PPO | Admitting: Family Medicine

## 2018-11-18 VITALS — BP 112/80 | HR 78 | Temp 98.0°F | Resp 16 | Ht 70.0 in | Wt 214.1 lb

## 2018-11-18 DIAGNOSIS — E669 Obesity, unspecified: Secondary | ICD-10-CM | POA: Diagnosis not present

## 2018-11-18 DIAGNOSIS — Z202 Contact with and (suspected) exposure to infections with a predominantly sexual mode of transmission: Secondary | ICD-10-CM | POA: Insufficient documentation

## 2018-11-18 DIAGNOSIS — Z0000 Encounter for general adult medical examination without abnormal findings: Secondary | ICD-10-CM | POA: Diagnosis not present

## 2018-11-18 LAB — CBC WITH DIFFERENTIAL/PLATELET
Basophils Absolute: 0 10*3/uL (ref 0.0–0.1)
Basophils Relative: 0.8 % (ref 0.0–3.0)
Eosinophils Absolute: 0.1 10*3/uL (ref 0.0–0.7)
Eosinophils Relative: 1.6 % (ref 0.0–5.0)
HCT: 46.9 % (ref 39.0–52.0)
Hemoglobin: 15.7 g/dL (ref 13.0–17.0)
Lymphocytes Relative: 38.4 % (ref 12.0–46.0)
Lymphs Abs: 1.5 10*3/uL (ref 0.7–4.0)
MCHC: 33.4 g/dL (ref 30.0–36.0)
MCV: 87.7 fl (ref 78.0–100.0)
Monocytes Absolute: 0.4 10*3/uL (ref 0.1–1.0)
Monocytes Relative: 10.8 % (ref 3.0–12.0)
Neutro Abs: 1.9 10*3/uL (ref 1.4–7.7)
Neutrophils Relative %: 48.4 % (ref 43.0–77.0)
Platelets: 188 10*3/uL (ref 150.0–400.0)
RBC: 5.35 Mil/uL (ref 4.22–5.81)
RDW: 13.1 % (ref 11.5–15.5)
WBC: 4 10*3/uL (ref 4.0–10.5)

## 2018-11-18 LAB — BASIC METABOLIC PANEL
BUN: 12 mg/dL (ref 6–23)
CO2: 29 mEq/L (ref 19–32)
Calcium: 9.6 mg/dL (ref 8.4–10.5)
Chloride: 106 mEq/L (ref 96–112)
Creatinine, Ser: 0.98 mg/dL (ref 0.40–1.50)
GFR: 106.59 mL/min (ref >60.00)
Glucose, Bld: 90 mg/dL (ref 70–99)
Potassium: 4.2 mEq/L (ref 3.5–5.1)
Sodium: 140 mEq/L (ref 135–145)

## 2018-11-18 LAB — HEPATIC FUNCTION PANEL
ALT: 20 U/L (ref 0–53)
AST: 23 U/L (ref 0–37)
Albumin: 4.7 g/dL (ref 3.5–5.2)
Alkaline Phosphatase: 57 U/L (ref 39–117)
Bilirubin, Direct: 0.1 mg/dL (ref 0.0–0.3)
Total Bilirubin: 0.6 mg/dL (ref 0.2–1.2)
Total Protein: 7.1 g/dL (ref 6.0–8.3)

## 2018-11-18 LAB — LIPID PANEL
Cholesterol: 167 mg/dL (ref 0–200)
HDL: 51 mg/dL (ref >39.00)
LDL Cholesterol: 103 mg/dL — ABNORMAL HIGH (ref 0–99)
NonHDL: 115.81
Total CHOL/HDL Ratio: 3
Triglycerides: 64 mg/dL (ref 0.0–149.0)
VLDL: 12.8 mg/dL (ref 0.0–40.0)

## 2018-11-18 NOTE — Assessment & Plan Note (Signed)
Pt's PE WNL w/ exception of weight gain.  UTD on Tdap.  Declines flu.  Check labs.  Anticipatory guidance provided.  

## 2018-11-18 NOTE — Progress Notes (Signed)
   Subjective:    Patient ID: [name], male    DOB: October 20, 1985, 33 y.o.   MRN: [id number]  HPI CPE- UTD on Tdap.  Declines flu.  Pt has gained 18 lbs since last visit   Review of Systems Patient reports no vision/hearing changes, anorexia, fever ,adenopathy, persistant/recurrent hoarseness, swallowing issues, chest pain, palpitations, edema, persistant/recurrent cough, hemoptysis, dyspnea (rest,exertional, paroxysmal nocturnal), gastrointestinal  bleeding (melena, rectal bleeding), abdominal pain, excessive heart burn, GU symptoms (dysuria, hematuria, voiding/incontinence issues) syncope, focal weakness, memory loss, numbness & tingling, skin/hair/nail changes, depression, anxiety, abnormal bruising/bleeding, musculoskeletal symptoms/signs.     Objective:   Physical Exam General Appearance:    Alert, cooperative, no distress, appears stated age  Head:    Normocephalic, without obvious abnormality, atraumatic  Eyes:    PERRL, conjunctiva/corneas clear, EOM's intact, fundi    benign, both eyes       Ears:    Normal TM's and external ear canals, both ears  Nose:   Deferred due to COVID  Throat:   Neck:   Supple, symmetrical, trachea midline, no adenopathy;       thyroid:  No enlargement/tenderness/nodules  Back:     Symmetric, no curvature, ROM normal, no CVA tenderness  Lungs:     Clear to auscultation bilaterally, respirations unlabored  Chest wall:    No tenderness or deformity  Heart:    Regular rate and rhythm, S1 and S2 normal, no murmur, rub   or gallop  Abdomen:     Soft, non-tender, bowel sounds active all four quadrants,    no masses, no organomegaly  Genitalia:    Normal male without lesion, masses,discharge or tenderness  Rectal:    Deferred due to young age  Extremities:   Extremities normal, atraumatic, no cyanosis or edema  Pulses:   2+ and symmetric all extremities  Skin:   Skin color, texture, turgor normal, no rashes or lesions  Lymph nodes:   Cervical,  supraclavicular, and axillary nodes normal  Neurologic:   CNII-XII intact. Normal strength, sensation and reflexes      throughout          Assessment & Plan:

## 2018-11-18 NOTE — Assessment & Plan Note (Signed)
Pt has gained 18 lbs since last visit.  Stressed need for healthy diet and regular exercise.  Check labs to risk stratify.  Will follow. 

## 2018-11-18 NOTE — Patient Instructions (Signed)
Follow up in 1 year or as needed We'll notify you of your lab results and make any changes if needed Keep up the good work on healthy diet and regular exercise- you look great! Call with any questions or concerns Stay Safe!  Stay Healthy! Happy Birthday!!! 

## 2018-11-19 LAB — HIV ANTIBODY (ROUTINE TESTING W REFLEX): HIV 1&2 Ab, 4th Generation: NONREACTIVE

## 2018-11-19 LAB — RPR: RPR Ser Ql: NONREACTIVE

## 2018-11-20 LAB — TSH: TSH: 1.08 u[IU]/mL (ref 0.35–4.50)

## 2018-11-21 LAB — URINE CYTOLOGY ANCILLARY ONLY
Chlamydia: NEGATIVE
Comment: NEGATIVE
Comment: NORMAL
Neisseria Gonorrhea: NEGATIVE

## 2019-11-19 ENCOUNTER — Other Ambulatory Visit: Payer: Self-pay

## 2019-11-19 ENCOUNTER — Other Ambulatory Visit (HOSPITAL_COMMUNITY)
Admission: RE | Admit: 2019-11-19 | Discharge: 2019-11-19 | Disposition: A | Discharge status: Home / Self Care | Payer: Federal, State, Local not specified - PPO | Source: Ambulatory Visit | Attending: Family Medicine | Admitting: Family Medicine

## 2019-11-19 ENCOUNTER — Ambulatory Visit (INDEPENDENT_AMBULATORY_CARE_PROVIDER_SITE_OTHER): Payer: Federal, State, Local not specified - PPO | Admitting: Family Medicine

## 2019-11-19 ENCOUNTER — Encounter: Payer: Self-pay | Admitting: Family Medicine

## 2019-11-19 VITALS — BP 128/80 | HR 66 | Temp 98.2°F | Resp 17 | Ht 68.0 in | Wt 216.8 lb

## 2019-11-19 DIAGNOSIS — Z202 Contact with and (suspected) exposure to infections with a predominantly sexual mode of transmission: Secondary | ICD-10-CM | POA: Insufficient documentation

## 2019-11-19 DIAGNOSIS — Z1159 Encounter for screening for other viral diseases: Secondary | ICD-10-CM | POA: Diagnosis not present

## 2019-11-19 DIAGNOSIS — E669 Obesity, unspecified: Secondary | ICD-10-CM | POA: Diagnosis not present

## 2019-11-19 DIAGNOSIS — Z0000 Encounter for general adult medical examination without abnormal findings: Secondary | ICD-10-CM

## 2019-11-19 LAB — BASIC METABOLIC PANEL
BUN: 16 mg/dL (ref 6–23)
CO2: 29 mEq/L (ref 19–32)
Calcium: 9.3 mg/dL (ref 8.4–10.5)
Chloride: 106 mEq/L (ref 96–112)
Creatinine, Ser: 1.05 mg/dL (ref 0.40–1.50)
GFR: 92.95 mL/min (ref >60.00)
Glucose, Bld: 72 mg/dL (ref 70–99)
Potassium: 4.4 mEq/L (ref 3.5–5.1)
Sodium: 141 mEq/L (ref 135–145)

## 2019-11-19 LAB — HEPATIC FUNCTION PANEL
ALT: 17 U/L (ref 0–53)
AST: 17 U/L (ref 0–37)
Albumin: 4.4 g/dL (ref 3.5–5.2)
Alkaline Phosphatase: 59 U/L (ref 39–117)
Bilirubin, Direct: 0.1 mg/dL (ref 0.0–0.3)
Total Bilirubin: 0.5 mg/dL (ref 0.2–1.2)
Total Protein: 6.7 g/dL (ref 6.0–8.3)

## 2019-11-19 LAB — CBC WITH DIFFERENTIAL/PLATELET
Basophils Absolute: 0 10*3/uL (ref 0.0–0.1)
Basophils Relative: 1.1 % (ref 0.0–3.0)
Eosinophils Absolute: 0.1 10*3/uL (ref 0.0–0.7)
Eosinophils Relative: 1.3 % (ref 0.0–5.0)
HCT: 47.5 % (ref 39.0–52.0)
Hemoglobin: 16.1 g/dL (ref 13.0–17.0)
Lymphocytes Relative: 34.7 % (ref 12.0–46.0)
Lymphs Abs: 1.4 10*3/uL (ref 0.7–4.0)
MCHC: 33.9 g/dL (ref 30.0–36.0)
MCV: 86 fl (ref 78.0–100.0)
Monocytes Absolute: 0.4 10*3/uL (ref 0.1–1.0)
Monocytes Relative: 9.1 % (ref 3.0–12.0)
Neutro Abs: 2.1 10*3/uL (ref 1.4–7.7)
Neutrophils Relative %: 53.8 % (ref 43.0–77.0)
Platelets: 202 10*3/uL (ref 150.0–400.0)
RBC: 5.52 Mil/uL (ref 4.22–5.81)
RDW: 13 % (ref 11.5–15.5)
WBC: 3.9 10*3/uL — ABNORMAL LOW (ref 4.0–10.5)

## 2019-11-19 LAB — LIPID PANEL
Cholesterol: 160 mg/dL (ref 0–200)
HDL: 51.7 mg/dL (ref >39.00)
LDL Cholesterol: 98 mg/dL (ref 0–99)
NonHDL: 108.39
Total CHOL/HDL Ratio: 3
Triglycerides: 53 mg/dL (ref 0.0–149.0)
VLDL: 10.6 mg/dL (ref 0.0–40.0)

## 2019-11-19 LAB — TSH: TSH: 1.16 u[IU]/mL (ref 0.35–4.50)

## 2019-11-19 NOTE — Assessment & Plan Note (Signed)
Pt's PE WNL.  Very muscular which makes BMI inaccurate.  UTD on COVID vaccine, Tdap.  Declines flu.  Check labs.  Anticipatory guidance provided.

## 2019-11-19 NOTE — Assessment & Plan Note (Signed)
The label of obesity is inaccurate based on pt's muscle mass.  No concerns at this time

## 2019-11-19 NOTE — Progress Notes (Signed)
   Subjective:    Patient ID: [name], male    DOB: November 30, 1985, 34 y.o.   MRN: [id number]  HPI CPE- UTD on COVID vaccines, declines flu shot.  Will get Hep C screen today.  'I feel great'   Review of Systems Patient reports no vision/hearing changes, anorexia, fever ,adenopathy, persistant/recurrent hoarseness, swallowing issues, chest pain, palpitations, edema, persistant/recurrent cough, hemoptysis, dyspnea (rest,exertional, paroxysmal nocturnal), gastrointestinal  bleeding (melena, rectal bleeding), abdominal pain, excessive heart burn, GU symptoms (dysuria, hematuria, voiding/incontinence issues) syncope, focal weakness, memory loss, numbness & tingling, skin/hair/nail changes, depression, anxiety, abnormal bruising/bleeding, musculoskeletal symptoms/signs.   This visit occurred during the SARS-CoV-2 public health emergency.  Safety protocols were in place, including screening questions prior to the visit, additional usage of staff PPE, and extensive cleaning of exam room while observing appropriate contact time as indicated for disinfecting solutions.       Objective:   Physical Exam General Appearance:    Alert, cooperative, no distress, appears stated age  Head:    Normocephalic, without obvious abnormality, atraumatic  Eyes:    PERRL, conjunctiva/corneas clear, EOM's intact, fundi    benign, both eyes       Ears:    Normal TM's and external ear canals, both ears  Nose:   Deferred due to COVID  Throat:   Neck:   Supple, symmetrical, trachea midline, no adenopathy;       thyroid:  No enlargement/tenderness/nodules  Back:     Symmetric, no curvature, ROM normal, no CVA tenderness  Lungs:     Clear to auscultation bilaterally, respirations unlabored  Chest wall:    No tenderness or deformity  Heart:    Regular rate and rhythm, S1 and S2 normal, I/VImurmur, no rub or gallop  Abdomen:     Soft, non-tender, bowel sounds active all four quadrants,    no masses, no organomegaly    Genitalia:    Normal male without lesion, masses,discharge or tenderness  Rectal:    Deferred due to young age  Extremities:   Extremities normal, atraumatic, no cyanosis or edema  Pulses:   2+ and symmetric all extremities  Skin:   Skin color, texture, turgor normal, no rashes or lesions  Lymph nodes:   Cervical, supraclavicular, and axillary nodes normal  Neurologic:   CNII-XII intact. Normal strength, sensation and reflexes      throughout          Assessment & Plan:

## 2019-11-19 NOTE — Patient Instructions (Signed)
Follow up in 1 year or as needed We'll notify you of your lab results and make any changes if needed Keep up the good work!  You look great! Call with any questions or concerns Stay Safe!  Stay Healthy! Daisey Must w/ school! HAPPY BIRTHDAY!!!!

## 2019-11-20 LAB — URINE CYTOLOGY ANCILLARY ONLY
Chlamydia: NEGATIVE
Comment: NEGATIVE
Comment: NORMAL
Neisseria Gonorrhea: NEGATIVE

## 2019-11-20 LAB — HEPATITIS C ANTIBODY
Hepatitis C Ab: NONREACTIVE
SIGNAL TO CUT-OFF: 0.01 (ref <1.00)

## 2019-11-20 LAB — HIV ANTIBODY (ROUTINE TESTING W REFLEX): HIV 1&2 Ab, 4th Generation: NONREACTIVE

## 2019-11-20 LAB — RPR: RPR Ser Ql: NONREACTIVE

## 2019-11-24 ENCOUNTER — Encounter: Payer: Self-pay | Admitting: General Practice

## 2020-07-20 ENCOUNTER — Encounter: Payer: Self-pay | Admitting: *Deleted

## 2020-11-21 ENCOUNTER — Ambulatory Visit (INDEPENDENT_AMBULATORY_CARE_PROVIDER_SITE_OTHER): Payer: Federal, State, Local not specified - PPO | Admitting: Family Medicine

## 2020-11-21 ENCOUNTER — Encounter: Payer: Self-pay | Admitting: Family Medicine

## 2020-11-21 ENCOUNTER — Other Ambulatory Visit (HOSPITAL_COMMUNITY)
Admission: RE | Admit: 2020-11-21 | Discharge: 2020-11-21 | Disposition: A | Discharge status: Home / Self Care | Payer: Federal, State, Local not specified - PPO | Source: Ambulatory Visit | Attending: Family Medicine | Admitting: Family Medicine

## 2020-11-21 ENCOUNTER — Other Ambulatory Visit (INDEPENDENT_AMBULATORY_CARE_PROVIDER_SITE_OTHER): Payer: Federal, State, Local not specified - PPO

## 2020-11-21 ENCOUNTER — Other Ambulatory Visit: Payer: Self-pay

## 2020-11-21 VITALS — BP 112/76 | HR 67 | Temp 97.9°F | Resp 16 | Ht 68.0 in | Wt 215.8 lb

## 2020-11-21 DIAGNOSIS — E669 Obesity, unspecified: Secondary | ICD-10-CM

## 2020-11-21 DIAGNOSIS — Z202 Contact with and (suspected) exposure to infections with a predominantly sexual mode of transmission: Secondary | ICD-10-CM

## 2020-11-21 DIAGNOSIS — Z0000 Encounter for general adult medical examination without abnormal findings: Secondary | ICD-10-CM

## 2020-11-21 LAB — BASIC METABOLIC PANEL
BUN: 17 mg/dL (ref 6–23)
CO2: 28 mEq/L (ref 19–32)
Calcium: 9 mg/dL (ref 8.4–10.5)
Chloride: 104 mEq/L (ref 96–112)
Creatinine, Ser: 1.09 mg/dL (ref 0.40–1.50)
GFR: 88.24 mL/min (ref >60.00)
Glucose, Bld: 85 mg/dL (ref 70–99)
Potassium: 3.8 mEq/L (ref 3.5–5.1)
Sodium: 139 mEq/L (ref 135–145)

## 2020-11-21 LAB — HEPATIC FUNCTION PANEL
ALT: 15 U/L (ref 0–53)
AST: 18 U/L (ref 0–37)
Albumin: 4.2 g/dL (ref 3.5–5.2)
Alkaline Phosphatase: 55 U/L (ref 39–117)
Bilirubin, Direct: 0.1 mg/dL (ref 0.0–0.3)
Total Bilirubin: 0.6 mg/dL (ref 0.2–1.2)
Total Protein: 6.7 g/dL (ref 6.0–8.3)

## 2020-11-21 LAB — CBC WITH DIFFERENTIAL/PLATELET
Basophils Absolute: 0 10*3/uL (ref 0.0–0.1)
Basophils Relative: 0.9 % (ref 0.0–3.0)
Eosinophils Absolute: 0 10*3/uL (ref 0.0–0.7)
Eosinophils Relative: 1.2 % (ref 0.0–5.0)
HCT: 44.9 % (ref 39.0–52.0)
Hemoglobin: 14.8 g/dL (ref 13.0–17.0)
Lymphocytes Relative: 37.4 % (ref 12.0–46.0)
Lymphs Abs: 1.3 10*3/uL (ref 0.7–4.0)
MCHC: 33.1 g/dL (ref 30.0–36.0)
MCV: 87.3 fl (ref 78.0–100.0)
Monocytes Absolute: 0.4 10*3/uL (ref 0.1–1.0)
Monocytes Relative: 10.1 % (ref 3.0–12.0)
Neutro Abs: 1.8 10*3/uL (ref 1.4–7.7)
Neutrophils Relative %: 50.4 % (ref 43.0–77.0)
Platelets: 182 10*3/uL (ref 150.0–400.0)
RBC: 5.14 Mil/uL (ref 4.22–5.81)
RDW: 13 % (ref 11.5–15.5)
WBC: 3.6 10*3/uL — ABNORMAL LOW (ref 4.0–10.5)

## 2020-11-21 LAB — LIPID PANEL
Cholesterol: 141 mg/dL (ref 0–200)
HDL: 43.7 mg/dL (ref >39.00)
LDL Cholesterol: 86 mg/dL (ref 0–99)
NonHDL: 97.45
Total CHOL/HDL Ratio: 3
Triglycerides: 59 mg/dL (ref 0.0–149.0)
VLDL: 11.8 mg/dL (ref 0.0–40.0)

## 2020-11-21 LAB — TSH: TSH: 1.93 u[IU]/mL (ref 0.35–5.50)

## 2020-11-21 NOTE — Progress Notes (Signed)
   Subjective:    Patient ID: [name], male    DOB: 09/13/1985, 35 y.o.   MRN: [id number]  HPI CPE- UTD on Tdap.  Declines flu.  Health Maintenance  Topic Date Due   COVID-19 Vaccine (1) 12/07/2020 (Originally 05/20/1986)   INFLUENZA VACCINE  04/21/2021 (Originally 08/22/2020)   TETANUS/TDAP  09/17/2021   Hepatitis C Screening  Completed   HIV Screening  Completed   Pneumococcal Vaccine 67-35 Years old  Aged Out   HPV VACCINES  Aged Out      Review of Systems Patient reports no vision/hearing changes, anorexia, fever ,adenopathy, persistant/recurrent hoarseness, swallowing issues, chest pain, palpitations, edema, persistant/recurrent cough, hemoptysis, dyspnea (rest,exertional, paroxysmal nocturnal), gastrointestinal  bleeding (melena, rectal bleeding), abdominal pain, excessive heart burn, GU symptoms (dysuria, hematuria, voiding/incontinence issues) syncope, focal weakness, memory loss, numbness & tingling, skin/hair/nail changes, depression, anxiety, abnormal bruising/bleeding.   + L knee pain- had MRI in [place].  Has stopped doing squats, not currently running.  This visit occurred during the SARS-CoV-2 public health emergency.  Safety protocols were in place, including screening questions prior to the visit, additional usage of staff PPE, and extensive cleaning of exam room while observing appropriate contact time as indicated for disinfecting solutions.      Objective:   Physical Exam General Appearance:    Alert, cooperative, no distress, appears stated age  Head:    Normocephalic, without obvious abnormality, atraumatic  Eyes:    PERRL, conjunctiva/corneas clear, EOM's intact, fundi    benign, both eyes       Ears:    Normal TM's and external ear canals, both ears  Nose:   Deferred due to COVID  Throat:   Neck:   Supple, symmetrical, trachea midline, no adenopathy;       thyroid:  No enlargement/tenderness/nodules  Back:     Symmetric, no curvature, ROM normal, no CVA  tenderness  Lungs:     Clear to auscultation bilaterally, respirations unlabored  Chest wall:    No tenderness or deformity  Heart:    Regular rate and rhythm, S1 and S2 normal, I/VI SEM murmur, no rub or gallop  Abdomen:     Soft, non-tender, bowel sounds active all four quadrants,    no masses, no organomegaly  Genitalia:    Normal male without lesion, masses,discharge or tenderness  Rectal:    Deferred due to young age  Extremities:   Extremities normal, atraumatic, no cyanosis or edema  Pulses:   2+ and symmetric all extremities  Skin:   Skin color, texture, turgor normal, no rashes or lesions  Lymph nodes:   Cervical, supraclavicular, and axillary nodes normal  Neurologic:   CNII-XII intact. Normal strength, sensation and reflexes      throughout          Assessment & Plan:

## 2020-11-21 NOTE — Assessment & Plan Note (Signed)
Pt's PE WNL and unchanged from previous.  UTD on Tdap.  Declines flu.  Will get labs at [facility].  Anticipatory guidance provided.

## 2020-11-21 NOTE — Patient Instructions (Signed)
Follow up in 1 year or as needed Go to [address] to get your labs done [facility] notify you of your lab results and make any changes if needed Keep up the good work on healthy diet and regular exercise- you're doing great! Get a Jumpers Knee Brace, voltaren gel before exercise, ice after Call with any questions or concerns Stay Safe!  Stay Healthy! WELCOME BACK!!!!

## 2020-11-21 NOTE — Assessment & Plan Note (Signed)
Obese is a misleading term due to pt's muscle mass.  Check labs to risk stratify.  Will follow.

## 2020-11-22 LAB — URINE CYTOLOGY ANCILLARY ONLY
Chlamydia: NEGATIVE
Comment: NEGATIVE
Comment: NORMAL
Neisseria Gonorrhea: NEGATIVE

## 2020-11-22 LAB — RPR: RPR Ser Ql: NONREACTIVE

## 2020-11-22 LAB — HIV ANTIBODY (ROUTINE TESTING W REFLEX): HIV 1&2 Ab, 4th Generation: NONREACTIVE

## 2021-11-22 ENCOUNTER — Other Ambulatory Visit (HOSPITAL_COMMUNITY)
Admission: RE | Admit: 2021-11-22 | Discharge: 2021-11-22 | Disposition: A | Discharge status: Home / Self Care | Payer: Federal, State, Local not specified - PPO | Source: Ambulatory Visit | Attending: Family Medicine | Admitting: Family Medicine

## 2021-11-22 ENCOUNTER — Ambulatory Visit (INDEPENDENT_AMBULATORY_CARE_PROVIDER_SITE_OTHER): Payer: Federal, State, Local not specified - PPO | Admitting: Family Medicine

## 2021-11-22 ENCOUNTER — Encounter: Payer: Self-pay | Admitting: Family Medicine

## 2021-11-22 VITALS — BP 120/80 | HR 56 | Temp 100.1°F | Resp 18 | Ht 68.0 in | Wt 227.1 lb

## 2021-11-22 DIAGNOSIS — Z202 Contact with and (suspected) exposure to infections with a predominantly sexual mode of transmission: Secondary | ICD-10-CM

## 2021-11-22 DIAGNOSIS — Z23 Encounter for immunization: Secondary | ICD-10-CM | POA: Diagnosis not present

## 2021-11-22 DIAGNOSIS — E669 Obesity, unspecified: Secondary | ICD-10-CM | POA: Diagnosis not present

## 2021-11-22 DIAGNOSIS — Z0000 Encounter for general adult medical examination without abnormal findings: Secondary | ICD-10-CM

## 2021-11-22 LAB — LIPID PANEL
Cholesterol: 149 mg/dL (ref 0–200)
HDL: 50.8 mg/dL (ref >39.00)
LDL Cholesterol: 86 mg/dL (ref 0–99)
NonHDL: 98.4
Total CHOL/HDL Ratio: 3
Triglycerides: 62 mg/dL (ref 0.0–149.0)
VLDL: 12.4 mg/dL (ref 0.0–40.0)

## 2021-11-22 LAB — HEPATIC FUNCTION PANEL
ALT: 24 U/L (ref 0–53)
AST: 25 U/L (ref 0–37)
Albumin: 4.7 g/dL (ref 3.5–5.2)
Alkaline Phosphatase: 61 U/L (ref 39–117)
Bilirubin, Direct: 0.1 mg/dL (ref 0.0–0.3)
Total Bilirubin: 0.4 mg/dL (ref 0.2–1.2)
Total Protein: 7.6 g/dL (ref 6.0–8.3)

## 2021-11-22 LAB — CBC WITH DIFFERENTIAL/PLATELET
Basophils Absolute: 0 10*3/uL (ref 0.0–0.1)
Basophils Relative: 0.7 % (ref 0.0–3.0)
Eosinophils Absolute: 0.1 10*3/uL (ref 0.0–0.7)
Eosinophils Relative: 1.5 % (ref 0.0–5.0)
HCT: 47.2 % (ref 39.0–52.0)
Hemoglobin: 15.5 g/dL (ref 13.0–17.0)
Lymphocytes Relative: 37.5 % (ref 12.0–46.0)
Lymphs Abs: 1.6 10*3/uL (ref 0.7–4.0)
MCHC: 32.8 g/dL (ref 30.0–36.0)
MCV: 87.4 fl (ref 78.0–100.0)
Monocytes Absolute: 0.4 10*3/uL (ref 0.1–1.0)
Monocytes Relative: 9.6 % (ref 3.0–12.0)
Neutro Abs: 2.1 10*3/uL (ref 1.4–7.7)
Neutrophils Relative %: 50.7 % (ref 43.0–77.0)
Platelets: 197 10*3/uL (ref 150.0–400.0)
RBC: 5.39 Mil/uL (ref 4.22–5.81)
RDW: 13.2 % (ref 11.5–15.5)
WBC: 4.1 10*3/uL (ref 4.0–10.5)

## 2021-11-22 LAB — BASIC METABOLIC PANEL
BUN: 19 mg/dL (ref 6–23)
CO2: 30 mEq/L (ref 19–32)
Calcium: 9.6 mg/dL (ref 8.4–10.5)
Chloride: 106 mEq/L (ref 96–112)
Creatinine, Ser: 1.04 mg/dL (ref 0.40–1.50)
GFR: 92.7 mL/min (ref >60.00)
Glucose, Bld: 90 mg/dL (ref 70–99)
Potassium: 4.2 mEq/L (ref 3.5–5.1)
Sodium: 141 mEq/L (ref 135–145)

## 2021-11-22 LAB — TSH: TSH: 1.72 u[IU]/mL (ref 0.35–5.50)

## 2021-11-22 NOTE — Patient Instructions (Signed)
Follow up in 1 year or as needed We'll notify you of your lab results and make any changes if needed Keep up the good work on healthy diet and regular exercise- you look great!!! Call with any questions or concerns Stay Safe!  Stay Healthy! Happy [place] Birthday!!! 

## 2021-11-22 NOTE — Assessment & Plan Note (Signed)
BMI is misleading as this doesn't take into account muscle mass.  Check labs to risk stratify.  Will follow.

## 2021-11-22 NOTE — Progress Notes (Signed)
   Subjective:    Patient ID: [name], male    DOB: [birth year], 36 y.o.   MRN: [id number]  HPI CPE- due for Tdap.  Declines flu.  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  Currently Scientist, physiological at [employer].  No concerns today   Review of Systems Patient reports no vision/hearing changes, anorexia, fever ,adenopathy, persistant/recurrent hoarseness, swallowing issues, chest pain, palpitations, edema, persistant/recurrent cough, hemoptysis, dyspnea (rest,exertional, paroxysmal nocturnal), gastrointestinal  bleeding (melena, rectal bleeding), abdominal pain, excessive heart burn, GU symptoms (dysuria, hematuria, voiding/incontinence issues) syncope, focal weakness, memory loss, numbness & tingling, skin/hair/nail changes, depression, anxiety, abnormal bruising/bleeding, musculoskeletal symptoms/signs.     Objective:   Physical Exam General Appearance:    Alert, cooperative, no distress, appears stated age  Head:    Normocephalic, without obvious abnormality, atraumatic  Eyes:    PERRL, conjunctiva/corneas clear, EOM's intact both eyes       Ears:    Normal TM's and external ear canals, both ears  Nose:   Nares normal, septum midline, mucosa normal, no drainage   or sinus tenderness  Throat:   Lips, mucosa, and tongue normal; teeth and gums normal  Neck:   Supple, symmetrical, trachea midline, no adenopathy;       thyroid:  No enlargement/tenderness/nodules  Back:     Symmetric, no curvature, ROM normal, no CVA tenderness  Lungs:     Clear to auscultation bilaterally, respirations unlabored  Chest wall:    No tenderness or deformity  Heart:    Regular rate and rhythm, S1 and S2 normal, no murmur, rub   or gallop  Abdomen:     Soft, non-tender, bowel sounds active all four quadrants,    no masses, no organomegaly  Genitalia:    deferred  Rectal:    Extremities:   Extremities normal, atraumatic, no cyanosis or edema  Pulses:   2+ and symmetric all extremities  Skin:   Skin color,  texture, turgor normal, no rashes or lesions  Lymph nodes:   Cervical, supraclavicular, and axillary nodes normal  Neurologic:   CNII-XII intact. Normal strength, sensation and reflexes      throughout          Assessment & Plan:

## 2021-11-22 NOTE — Assessment & Plan Note (Signed)
Pt's PE WNL.  Tdap given.  Check labs.  Anticipatory guidance provided.  

## 2021-11-23 LAB — HIV ANTIBODY (ROUTINE TESTING W REFLEX): HIV 1&2 Ab, 4th Generation: NONREACTIVE

## 2021-11-23 LAB — URINE CYTOLOGY ANCILLARY ONLY
Chlamydia: NEGATIVE
Comment: NEGATIVE
Comment: NORMAL
Neisseria Gonorrhea: NEGATIVE

## 2021-11-23 LAB — RPR: RPR Ser Ql: NONREACTIVE

## 2021-11-24 ENCOUNTER — Telehealth: Payer: Self-pay

## 2021-11-24 NOTE — Telephone Encounter (Signed)
Left pt a VM to call office in regards to lab results  

## 2021-11-24 NOTE — Telephone Encounter (Signed)
I informed pt of lab results °

## 2021-11-24 NOTE — Telephone Encounter (Signed)
-----   Message from Midge Minium, MD sent at 11/23/2021  7:40 AM EDT ----- Labs look great!  No changes at this time.  Waiting on STD test results

## 2022-11-27 ENCOUNTER — Ambulatory Visit (INDEPENDENT_AMBULATORY_CARE_PROVIDER_SITE_OTHER): Payer: Federal, State, Local not specified - PPO | Admitting: Family Medicine

## 2022-11-27 ENCOUNTER — Other Ambulatory Visit (HOSPITAL_COMMUNITY)
Admission: RE | Admit: 2022-11-27 | Discharge: 2022-11-27 | Disposition: A | Discharge status: Home / Self Care | Payer: Federal, State, Local not specified - PPO | Source: Ambulatory Visit | Attending: Family Medicine | Admitting: Family Medicine

## 2022-11-27 ENCOUNTER — Encounter: Payer: Self-pay | Admitting: Family Medicine

## 2022-11-27 VITALS — BP 112/68 | HR 52 | Temp 97.8°F | Ht 70.5 in | Wt 232.4 lb

## 2022-11-27 DIAGNOSIS — E669 Obesity, unspecified: Secondary | ICD-10-CM

## 2022-11-27 DIAGNOSIS — Z202 Contact with and (suspected) exposure to infections with a predominantly sexual mode of transmission: Secondary | ICD-10-CM | POA: Insufficient documentation

## 2022-11-27 DIAGNOSIS — T781XXA Other adverse food reactions, not elsewhere classified, initial encounter: Secondary | ICD-10-CM | POA: Diagnosis not present

## 2022-11-27 DIAGNOSIS — Z0000 Encounter for general adult medical examination without abnormal findings: Secondary | ICD-10-CM

## 2022-11-27 NOTE — Assessment & Plan Note (Signed)
Pt's PE WNL w/ exception of BMI.  UTD on Tdap.  Declines flu.  Check labs.  Anticipatory guidance provided.

## 2022-11-27 NOTE — Patient Instructions (Signed)
Follow up in 1 year or as needed We'll notify you of your lab results and make any changes if needed Keep up the good work on healthy diet and regular exercise- you look great!!! Call with any questions or concerns Stay Safe!  Stay Healthy! Happy [place] Birthday!!! 

## 2022-11-27 NOTE — Progress Notes (Signed)
   Subjective:    Patient ID: [name], male    DOB: December 01, 1985, 37 y.o.   MRN: [id number]  HPI CPE- UTD on Tdap.  Pt reports feeling 'great'.  Patient Care Team    Relationship Specialty Notifications Start End  Sheliah Hatch, MD PCP - General   01/04/10     Health Maintenance  Topic Date Due   INFLUENZA VACCINE  Never done   COVID-19 Vaccine (1 - 2023-24 season) Never done   DTaP/Tdap/Td (3 - Td or Tdap) 11/23/2031   Hepatitis C Screening  Completed   HIV Screening  Completed   HPV VACCINES  Aged Out      Review of Systems Patient reports no vision/hearing changes, anorexia, fever ,adenopathy, persistant/recurrent hoarseness, swallowing issues, chest pain, palpitations, edema, persistant/recurrent cough, hemoptysis, dyspnea (rest,exertional, paroxysmal nocturnal), gastrointestinal  bleeding (melena, rectal bleeding), abdominal pain, excessive heart burn, GU symptoms (dysuria, hematuria, voiding/incontinence issues) syncope, focal weakness, memory loss, numbness & tingling, skin/hair/nail changes, depression, anxiety, abnormal bruising/bleeding, musculoskeletal symptoms/signs.   + food allergy- mouth started itching, felt like it was swelling, some trouble swallowing.  Was given a medication by EMTs on site and sxs resolved within 20 minutes.  No hx of similar.      Objective:   Physical Exam General Appearance:    Alert, cooperative, no distress, appears stated age  Head:    Normocephalic, without obvious abnormality, atraumatic  Eyes:    PERRL, conjunctiva/corneas clear, EOM's intact both eyes       Ears:    Normal TM's and external ear canals, both ears  Nose:   Nares normal, septum midline, mucosa normal, no drainage   or sinus tenderness  Throat:   Lips, mucosa, and tongue normal; teeth and gums normal  Neck:   Supple, symmetrical, trachea midline, no adenopathy;       thyroid:  No enlargement/tenderness/nodules  Back:     Symmetric, no curvature, ROM normal, no  CVA tenderness  Lungs:     Clear to auscultation bilaterally, respirations unlabored  Chest wall:    No tenderness or deformity  Heart:    Regular rate and rhythm, S1 and S2 normal, no murmur, rub   or gallop  Abdomen:     Soft, non-tender, bowel sounds active all four quadrants,    no masses, no organomegaly  Genitalia:    Deferred   Rectal:    Extremities:   Extremities normal, atraumatic, no cyanosis or edema  Pulses:   2+ and symmetric all extremities  Skin:   Skin color, texture, turgor normal, no rashes or lesions  Lymph nodes:   Cervical, supraclavicular, and axillary nodes normal  Neurologic:   CNII-XII intact. Normal strength, sensation and reflexes      throughout          Assessment & Plan:

## 2022-11-28 ENCOUNTER — Telehealth: Payer: Self-pay

## 2022-11-28 LAB — URINE CYTOLOGY ANCILLARY ONLY
Chlamydia: NEGATIVE
Comment: NEGATIVE
Comment: NORMAL
Neisseria Gonorrhea: NEGATIVE

## 2022-11-28 LAB — RPR: RPR Ser Ql: NONREACTIVE

## 2022-11-28 LAB — HIV ANTIBODY (ROUTINE TESTING W REFLEX): HIV 1&2 Ab, 4th Generation: NONREACTIVE

## 2022-11-28 NOTE — Telephone Encounter (Signed)
Pt has been informed.

## 2022-11-28 NOTE — Addendum Note (Signed)
Addended by: Sheliah Hatch on: 11/28/2022 02:53 PM   Modules accepted: Orders

## 2022-11-28 NOTE — Progress Notes (Signed)
Inquired with lab about potential add on, sent MyChart message to follow up with patient waiting on response for both

## 2022-11-28 NOTE — Telephone Encounter (Signed)
-----   Message from Neena Rhymes sent at 11/28/2022  2:53 PM EST ----- Please apologize to patient that I missed ordering his routine physical labs while he was here yesterday (too busy worrying about the other labs and the referral).  I have entered his labs as future and he can go to [facility] at his convenience.  Again, my apologies!!!

## 2022-11-29 ENCOUNTER — Telehealth: Payer: Self-pay

## 2022-11-29 NOTE — Telephone Encounter (Signed)
-----   Message from Neena Rhymes sent at 11/29/2022 11:31 AM EST ----- STD testing is negative- great news!

## 2022-11-29 NOTE — Telephone Encounter (Signed)
Pt has been notified.

## 2022-11-29 NOTE — Telephone Encounter (Signed)
Pt called back. °

## 2023-01-21 ENCOUNTER — Ambulatory Visit: Payer: Self-pay | Admitting: Allergy

## 2023-02-12 ENCOUNTER — Ambulatory Visit: Payer: Self-pay | Admitting: Internal Medicine

## 2023-11-28 ENCOUNTER — Encounter: Payer: Federal, State, Local not specified - PPO | Admitting: Family Medicine

## 2023-12-10 ENCOUNTER — Encounter: Payer: Self-pay | Admitting: Family Medicine

## 2023-12-10 ENCOUNTER — Ambulatory Visit (INDEPENDENT_AMBULATORY_CARE_PROVIDER_SITE_OTHER): Admitting: Family Medicine

## 2023-12-10 VITALS — BP 110/58 | HR 78 | Temp 98.0°F | Ht 70.5 in | Wt 223.4 lb

## 2023-12-10 DIAGNOSIS — Z0000 Encounter for general adult medical examination without abnormal findings: Secondary | ICD-10-CM | POA: Diagnosis not present

## 2023-12-10 DIAGNOSIS — E669 Obesity, unspecified: Secondary | ICD-10-CM | POA: Diagnosis not present

## 2023-12-10 DIAGNOSIS — Z202 Contact with and (suspected) exposure to infections with a predominantly sexual mode of transmission: Secondary | ICD-10-CM

## 2023-12-10 NOTE — Patient Instructions (Signed)
Follow up in 1 year or as needed  We'll notify you of your lab results and make any changes if needed Keep up the good work on healthy diet and regular exercise- you're doing great! Call with any questions or concerns Stay Safe!  Stay Healthy! Happy Holidays!!!

## 2023-12-10 NOTE — Progress Notes (Signed)
   Subjective:    Patient ID: [name], male    DOB: 1985/03/17, 38 y.o.   MRN: [id number]  HPI CPE- UTD on Tdap  Patient Care Team    Relationship Specialty Notifications Start End  Mahlon Comer BRAVO, MD PCP - General   01/04/10     Health Maintenance  Topic Date Due   Hepatitis B Vaccines 19-59 Average Risk (1 of 3 - 19+ 3-dose series) Never done   HPV VACCINES (1 - 3-dose SCDM series) Never done   Influenza Vaccine  Never done   COVID-19 Vaccine (1 - 2025-26 season) Never done   DTaP/Tdap/Td (3 - Td or Tdap) 11/23/2031   Hepatitis C Screening  Completed   HIV Screening  Completed   Pneumococcal Vaccine  Aged Out   Meningococcal B Vaccine  Aged Out      Review of Systems Patient reports no vision/hearing changes, anorexia, fever ,adenopathy, persistant/recurrent hoarseness, swallowing issues, chest pain, palpitations, edema, persistant/recurrent cough, hemoptysis, dyspnea (rest,exertional, paroxysmal nocturnal), gastrointestinal  bleeding (melena, rectal bleeding), abdominal pain, excessive heart burn, GU symptoms (dysuria, hematuria, voiding/incontinence issues) syncope, focal weakness, memory loss, numbness & tingling, skin/hair/nail changes, depression, anxiety, abnormal bruising/bleeding, musculoskeletal symptoms/signs.   + 9 lb weight loss    Objective:   Physical Exam General Appearance:    Alert, cooperative, no distress, appears stated age  Head:    Normocephalic, without obvious abnormality, atraumatic  Eyes:    PERRL, conjunctiva/corneas clear, EOM's intact both eyes       Ears:    Normal TM's and external ear canals, both ears  Nose:   Nares normal, septum midline, mucosa normal, no drainage   or sinus tenderness  Throat:   Lips, mucosa, and tongue normal; teeth and gums normal  Neck:   Supple, symmetrical, trachea midline, no adenopathy;       thyroid :  No enlargement/tenderness/nodules  Back:     Symmetric, no curvature, ROM normal, no CVA tenderness   Lungs:     Clear to auscultation bilaterally, respirations unlabored  Chest wall:    No tenderness or deformity  Heart:    Regular rate and rhythm, S1 and S2 normal, no murmur, rub   or gallop  Abdomen:     Soft, non-tender, bowel sounds active all four quadrants,    no masses, no organomegaly  Genitalia:    deferred  Rectal:    Extremities:   Extremities normal, atraumatic, no cyanosis or edema  Pulses:   2+ and symmetric all extremities  Skin:   Skin color, texture, turgor normal, no rashes or lesions  Lymph nodes:   Cervical, supraclavicular, and axillary nodes normal  Neurologic:   CNII-XII intact. Normal strength, sensation and reflexes      throughout         Assessment & Plan:

## 2023-12-10 NOTE — Assessment & Plan Note (Signed)
 Pt's PE WNL w/ exception of BMI.  He is down 9 lbs since last visit.  Applauded his efforts.  Check labs.  Anticipatory guidance provided.

## 2023-12-11 ENCOUNTER — Ambulatory Visit: Payer: Self-pay | Admitting: Family Medicine

## 2023-12-11 LAB — HEPATIC FUNCTION PANEL
ALT: 24 U/L (ref 0–53)
AST: 27 U/L (ref 0–37)
Albumin: 4.4 g/dL (ref 3.5–5.2)
Alkaline Phosphatase: 59 U/L (ref 39–117)
Bilirubin, Direct: 0.1 mg/dL (ref 0.0–0.3)
Total Bilirubin: 0.6 mg/dL (ref 0.2–1.2)
Total Protein: 7.4 g/dL (ref 6.0–8.3)

## 2023-12-11 LAB — LIPID PANEL
Cholesterol: 178 mg/dL (ref 0–200)
HDL: 48.5 mg/dL (ref >39.00)
LDL Cholesterol: 118 mg/dL — ABNORMAL HIGH (ref 0–99)
NonHDL: 129.6
Total CHOL/HDL Ratio: 4
Triglycerides: 59 mg/dL (ref 0.0–149.0)
VLDL: 11.8 mg/dL (ref 0.0–40.0)

## 2023-12-11 LAB — CBC WITH DIFFERENTIAL/PLATELET
Basophils Absolute: 0 K/uL (ref 0.0–0.1)
Basophils Relative: 1.1 % (ref 0.0–3.0)
Eosinophils Absolute: 0.1 K/uL (ref 0.0–0.7)
Eosinophils Relative: 2.1 % (ref 0.0–5.0)
HCT: 45.7 % (ref 39.0–52.0)
Hemoglobin: 15.2 g/dL (ref 13.0–17.0)
Lymphocytes Relative: 37.7 % (ref 12.0–46.0)
Lymphs Abs: 1.5 K/uL (ref 0.7–4.0)
MCHC: 33.3 g/dL (ref 30.0–36.0)
MCV: 86.3 fl (ref 78.0–100.0)
Monocytes Absolute: 0.4 K/uL (ref 0.1–1.0)
Monocytes Relative: 11.1 % (ref 3.0–12.0)
Neutro Abs: 1.9 K/uL (ref 1.4–7.7)
Neutrophils Relative %: 48 % (ref 43.0–77.0)
Platelets: 204 K/uL (ref 150.0–400.0)
RBC: 5.29 Mil/uL (ref 4.22–5.81)
RDW: 13.3 % (ref 11.5–15.5)
WBC: 4 K/uL (ref 4.0–10.5)

## 2023-12-11 LAB — TSH: TSH: 0.94 u[IU]/mL (ref 0.35–5.50)

## 2023-12-11 LAB — BASIC METABOLIC PANEL WITH GFR
BUN: 15 mg/dL (ref 6–23)
CO2: 30 meq/L (ref 19–32)
Calcium: 9.5 mg/dL (ref 8.4–10.5)
Chloride: 103 meq/L (ref 96–112)
Creatinine, Ser: 0.97 mg/dL (ref 0.40–1.50)
GFR: 99.34 mL/min (ref >60.00)
Glucose, Bld: 55 mg/dL — ABNORMAL LOW (ref 70–99)
Potassium: 4.6 meq/L (ref 3.5–5.1)
Sodium: 140 meq/L (ref 135–145)

## 2023-12-11 LAB — RPR: RPR Ser Ql: NONREACTIVE

## 2023-12-11 LAB — HIV ANTIBODY (ROUTINE TESTING W REFLEX)
HIV 1&2 Ab, 4th Generation: NONREACTIVE
HIV FINAL INTERPRETATION: NEGATIVE

## 2023-12-11 NOTE — Telephone Encounter (Unsigned)
 Copied from CRM #[id number]. Topic: Clinical - Lab/Test Results >> Dec 11, 2023  4:17 PM Thersia BROCKS wrote: Reason for CRM: Patient called in regarding missed call, relay message to patient. Patient understood and had no further questions

## 2024-12-10 ENCOUNTER — Encounter: Admitting: Family Medicine
